# Patient Record
Sex: Female | Born: 1945 | Race: White | Hispanic: No | Marital: Married | Smoking: Former smoker
Health system: Southern US, Community
[De-identification: ages and names within clinical notes are randomized; demographics above are authoritative.]

## PROBLEM LIST (undated history)

## (undated) DIAGNOSIS — I5032 Chronic diastolic (congestive) heart failure: Secondary | ICD-10-CM

## (undated) DIAGNOSIS — F32A Depression, unspecified: Secondary | ICD-10-CM

## (undated) DIAGNOSIS — N189 Chronic kidney disease, unspecified: Secondary | ICD-10-CM

## (undated) DIAGNOSIS — I2510 Atherosclerotic heart disease of native coronary artery without angina pectoris: Secondary | ICD-10-CM

## (undated) DIAGNOSIS — E785 Hyperlipidemia, unspecified: Secondary | ICD-10-CM

## (undated) DIAGNOSIS — G629 Polyneuropathy, unspecified: Secondary | ICD-10-CM

## (undated) DIAGNOSIS — E0590 Thyrotoxicosis, unspecified without thyrotoxic crisis or storm: Secondary | ICD-10-CM

## (undated) DIAGNOSIS — F329 Major depressive disorder, single episode, unspecified: Secondary | ICD-10-CM

## (undated) DIAGNOSIS — M797 Fibromyalgia: Secondary | ICD-10-CM

## (undated) DIAGNOSIS — E119 Type 2 diabetes mellitus without complications: Secondary | ICD-10-CM

## (undated) DIAGNOSIS — G709 Myoneural disorder, unspecified: Secondary | ICD-10-CM

## (undated) HISTORY — DX: Depression, unspecified: F32.A

## (undated) HISTORY — PX: CORONARY ARTERY BYPASS GRAFT: SHX141

## (undated) HISTORY — DX: Thyrotoxicosis, unspecified without thyrotoxic crisis or storm: E05.90

## (undated) HISTORY — DX: Atherosclerotic heart disease of native coronary artery without angina pectoris: I25.10

## (undated) HISTORY — DX: Hyperlipidemia, unspecified: E78.5

## (undated) HISTORY — DX: Fibromyalgia: M79.7

## (undated) HISTORY — DX: Major depressive disorder, single episode, unspecified: F32.9

## (undated) HISTORY — PX: KNEE SURGERY: SHX244

## (undated) HISTORY — DX: Myoneural disorder, unspecified: G70.9

## (undated) HISTORY — PX: CARDIAC VALVE REPLACEMENT: SHX585

## (undated) HISTORY — DX: Chronic kidney disease, unspecified: N18.9

---

## 1998-07-11 ENCOUNTER — Encounter: Payer: Self-pay | Admitting: Emergency Medicine

## 1998-07-12 ENCOUNTER — Inpatient Hospital Stay (HOSPITAL_COMMUNITY): Admission: EM | Admit: 1998-07-12 | Discharge: 1998-07-14 | Payer: Self-pay | Admitting: Emergency Medicine

## 1998-07-15 ENCOUNTER — Encounter: Admission: RE | Admit: 1998-07-15 | Discharge: 1998-07-15 | Payer: Self-pay | Admitting: Family Medicine

## 2000-03-01 ENCOUNTER — Emergency Department (HOSPITAL_COMMUNITY): Admission: EM | Admit: 2000-03-01 | Discharge: 2000-03-01 | Payer: Self-pay | Admitting: Emergency Medicine

## 2000-03-02 ENCOUNTER — Encounter: Payer: Self-pay | Admitting: Emergency Medicine

## 2003-02-07 ENCOUNTER — Encounter: Payer: Self-pay | Admitting: Otolaryngology

## 2003-02-07 ENCOUNTER — Encounter: Admission: RE | Admit: 2003-02-07 | Discharge: 2003-02-07 | Payer: Self-pay | Admitting: Otolaryngology

## 2004-07-10 ENCOUNTER — Emergency Department (HOSPITAL_COMMUNITY): Admission: EM | Admit: 2004-07-10 | Discharge: 2004-07-11 | Payer: Self-pay | Admitting: Emergency Medicine

## 2006-03-15 ENCOUNTER — Encounter: Payer: Self-pay | Admitting: Family Medicine

## 2011-10-10 ENCOUNTER — Ambulatory Visit (INDEPENDENT_AMBULATORY_CARE_PROVIDER_SITE_OTHER): Payer: Medicare Other | Admitting: Ophthalmology

## 2011-10-10 DIAGNOSIS — H35039 Hypertensive retinopathy, unspecified eye: Secondary | ICD-10-CM

## 2011-10-10 DIAGNOSIS — E11359 Type 2 diabetes mellitus with proliferative diabetic retinopathy without macular edema: Secondary | ICD-10-CM

## 2011-10-10 DIAGNOSIS — I10 Essential (primary) hypertension: Secondary | ICD-10-CM

## 2011-10-10 DIAGNOSIS — E1139 Type 2 diabetes mellitus with other diabetic ophthalmic complication: Secondary | ICD-10-CM

## 2011-10-13 ENCOUNTER — Other Ambulatory Visit (INDEPENDENT_AMBULATORY_CARE_PROVIDER_SITE_OTHER): Payer: Medicare Other | Admitting: Ophthalmology

## 2011-10-13 DIAGNOSIS — H3581 Retinal edema: Secondary | ICD-10-CM

## 2012-02-10 ENCOUNTER — Ambulatory Visit (INDEPENDENT_AMBULATORY_CARE_PROVIDER_SITE_OTHER): Payer: Medicare Other | Admitting: Ophthalmology

## 2012-02-10 DIAGNOSIS — H2000 Unspecified acute and subacute iridocyclitis: Secondary | ICD-10-CM

## 2012-02-10 DIAGNOSIS — E1139 Type 2 diabetes mellitus with other diabetic ophthalmic complication: Secondary | ICD-10-CM

## 2012-02-10 DIAGNOSIS — I10 Essential (primary) hypertension: Secondary | ICD-10-CM

## 2012-02-10 DIAGNOSIS — H35039 Hypertensive retinopathy, unspecified eye: Secondary | ICD-10-CM

## 2012-02-10 DIAGNOSIS — H43819 Vitreous degeneration, unspecified eye: Secondary | ICD-10-CM

## 2012-02-10 DIAGNOSIS — E11359 Type 2 diabetes mellitus with proliferative diabetic retinopathy without macular edema: Secondary | ICD-10-CM

## 2012-03-02 ENCOUNTER — Encounter (INDEPENDENT_AMBULATORY_CARE_PROVIDER_SITE_OTHER): Payer: Medicare Other | Admitting: Ophthalmology

## 2012-03-02 DIAGNOSIS — I10 Essential (primary) hypertension: Secondary | ICD-10-CM

## 2012-03-02 DIAGNOSIS — E11359 Type 2 diabetes mellitus with proliferative diabetic retinopathy without macular edema: Secondary | ICD-10-CM

## 2012-03-02 DIAGNOSIS — E1139 Type 2 diabetes mellitus with other diabetic ophthalmic complication: Secondary | ICD-10-CM

## 2012-03-02 DIAGNOSIS — H43819 Vitreous degeneration, unspecified eye: Secondary | ICD-10-CM

## 2012-03-02 DIAGNOSIS — H2000 Unspecified acute and subacute iridocyclitis: Secondary | ICD-10-CM

## 2012-05-02 ENCOUNTER — Encounter (INDEPENDENT_AMBULATORY_CARE_PROVIDER_SITE_OTHER): Payer: Medicare Other | Admitting: Ophthalmology

## 2012-05-02 DIAGNOSIS — H20029 Recurrent acute iridocyclitis, unspecified eye: Secondary | ICD-10-CM

## 2012-05-02 DIAGNOSIS — E1139 Type 2 diabetes mellitus with other diabetic ophthalmic complication: Secondary | ICD-10-CM

## 2012-05-02 DIAGNOSIS — E11359 Type 2 diabetes mellitus with proliferative diabetic retinopathy without macular edema: Secondary | ICD-10-CM

## 2012-05-23 ENCOUNTER — Encounter (INDEPENDENT_AMBULATORY_CARE_PROVIDER_SITE_OTHER): Payer: Medicare Other | Admitting: Ophthalmology

## 2012-05-23 DIAGNOSIS — H35039 Hypertensive retinopathy, unspecified eye: Secondary | ICD-10-CM

## 2012-05-23 DIAGNOSIS — E1165 Type 2 diabetes mellitus with hyperglycemia: Secondary | ICD-10-CM

## 2012-05-23 DIAGNOSIS — H3581 Retinal edema: Secondary | ICD-10-CM

## 2012-05-23 DIAGNOSIS — H43819 Vitreous degeneration, unspecified eye: Secondary | ICD-10-CM

## 2012-05-23 DIAGNOSIS — H209 Unspecified iridocyclitis: Secondary | ICD-10-CM

## 2012-05-23 DIAGNOSIS — E11359 Type 2 diabetes mellitus with proliferative diabetic retinopathy without macular edema: Secondary | ICD-10-CM

## 2012-05-23 DIAGNOSIS — E1139 Type 2 diabetes mellitus with other diabetic ophthalmic complication: Secondary | ICD-10-CM

## 2012-05-23 DIAGNOSIS — I10 Essential (primary) hypertension: Secondary | ICD-10-CM

## 2012-06-27 ENCOUNTER — Encounter (INDEPENDENT_AMBULATORY_CARE_PROVIDER_SITE_OTHER): Payer: Medicare Other | Admitting: Ophthalmology

## 2012-06-27 DIAGNOSIS — H35039 Hypertensive retinopathy, unspecified eye: Secondary | ICD-10-CM

## 2012-06-27 DIAGNOSIS — I10 Essential (primary) hypertension: Secondary | ICD-10-CM

## 2012-06-27 DIAGNOSIS — E1139 Type 2 diabetes mellitus with other diabetic ophthalmic complication: Secondary | ICD-10-CM

## 2012-06-27 DIAGNOSIS — E1165 Type 2 diabetes mellitus with hyperglycemia: Secondary | ICD-10-CM

## 2012-06-27 DIAGNOSIS — H43819 Vitreous degeneration, unspecified eye: Secondary | ICD-10-CM

## 2012-06-27 DIAGNOSIS — E11359 Type 2 diabetes mellitus with proliferative diabetic retinopathy without macular edema: Secondary | ICD-10-CM

## 2012-06-27 DIAGNOSIS — H1820 Unspecified corneal edema: Secondary | ICD-10-CM

## 2013-01-01 ENCOUNTER — Ambulatory Visit (INDEPENDENT_AMBULATORY_CARE_PROVIDER_SITE_OTHER): Payer: Medicare Other | Admitting: Ophthalmology

## 2013-01-01 DIAGNOSIS — I10 Essential (primary) hypertension: Secondary | ICD-10-CM

## 2013-01-01 DIAGNOSIS — E1165 Type 2 diabetes mellitus with hyperglycemia: Secondary | ICD-10-CM

## 2013-01-01 DIAGNOSIS — H43819 Vitreous degeneration, unspecified eye: Secondary | ICD-10-CM

## 2013-01-01 DIAGNOSIS — H35039 Hypertensive retinopathy, unspecified eye: Secondary | ICD-10-CM

## 2013-01-01 DIAGNOSIS — E1139 Type 2 diabetes mellitus with other diabetic ophthalmic complication: Secondary | ICD-10-CM

## 2013-01-01 DIAGNOSIS — E11359 Type 2 diabetes mellitus with proliferative diabetic retinopathy without macular edema: Secondary | ICD-10-CM

## 2013-02-21 ENCOUNTER — Encounter (INDEPENDENT_AMBULATORY_CARE_PROVIDER_SITE_OTHER): Payer: Medicare Other | Admitting: Ophthalmology

## 2013-02-21 DIAGNOSIS — E11359 Type 2 diabetes mellitus with proliferative diabetic retinopathy without macular edema: Secondary | ICD-10-CM

## 2013-02-21 DIAGNOSIS — H35039 Hypertensive retinopathy, unspecified eye: Secondary | ICD-10-CM

## 2013-02-21 DIAGNOSIS — E1139 Type 2 diabetes mellitus with other diabetic ophthalmic complication: Secondary | ICD-10-CM

## 2013-02-21 DIAGNOSIS — I10 Essential (primary) hypertension: Secondary | ICD-10-CM

## 2013-02-21 DIAGNOSIS — H43819 Vitreous degeneration, unspecified eye: Secondary | ICD-10-CM

## 2013-02-21 DIAGNOSIS — E1165 Type 2 diabetes mellitus with hyperglycemia: Secondary | ICD-10-CM

## 2013-04-11 ENCOUNTER — Emergency Department (HOSPITAL_COMMUNITY)
Admission: EM | Admit: 2013-04-11 | Discharge: 2013-04-11 | Disposition: A | Discharge status: Home / Self Care | Payer: PRIVATE HEALTH INSURANCE | Attending: Emergency Medicine | Admitting: Emergency Medicine

## 2013-04-11 ENCOUNTER — Emergency Department (HOSPITAL_COMMUNITY): Payer: PRIVATE HEALTH INSURANCE

## 2013-04-11 ENCOUNTER — Encounter (HOSPITAL_COMMUNITY): Payer: Self-pay | Admitting: Emergency Medicine

## 2013-04-11 DIAGNOSIS — G8929 Other chronic pain: Secondary | ICD-10-CM | POA: Insufficient documentation

## 2013-04-11 DIAGNOSIS — Y9389 Activity, other specified: Secondary | ICD-10-CM | POA: Insufficient documentation

## 2013-04-11 DIAGNOSIS — M542 Cervicalgia: Secondary | ICD-10-CM | POA: Insufficient documentation

## 2013-04-11 DIAGNOSIS — Z794 Long term (current) use of insulin: Secondary | ICD-10-CM | POA: Insufficient documentation

## 2013-04-11 DIAGNOSIS — R6889 Other general symptoms and signs: Secondary | ICD-10-CM | POA: Insufficient documentation

## 2013-04-11 DIAGNOSIS — Z79899 Other long term (current) drug therapy: Secondary | ICD-10-CM | POA: Insufficient documentation

## 2013-04-11 DIAGNOSIS — Y92410 Unspecified street and highway as the place of occurrence of the external cause: Secondary | ICD-10-CM | POA: Insufficient documentation

## 2013-04-11 DIAGNOSIS — S0993XA Unspecified injury of face, initial encounter: Secondary | ICD-10-CM | POA: Insufficient documentation

## 2013-04-11 DIAGNOSIS — E1149 Type 2 diabetes mellitus with other diabetic neurological complication: Secondary | ICD-10-CM | POA: Insufficient documentation

## 2013-04-11 DIAGNOSIS — E1142 Type 2 diabetes mellitus with diabetic polyneuropathy: Secondary | ICD-10-CM | POA: Insufficient documentation

## 2013-04-11 DIAGNOSIS — IMO0001 Reserved for inherently not codable concepts without codable children: Secondary | ICD-10-CM | POA: Insufficient documentation

## 2013-04-11 DIAGNOSIS — E785 Hyperlipidemia, unspecified: Secondary | ICD-10-CM | POA: Insufficient documentation

## 2013-04-11 HISTORY — DX: Type 2 diabetes mellitus without complications: E11.9

## 2013-04-11 MED ORDER — OXYCODONE-ACETAMINOPHEN 5-325 MG PO TABS: 1.0000 | ORAL_TABLET | ORAL | Status: DC | PRN

## 2013-04-11 MED ORDER — FLUORESCEIN SODIUM 1 MG OP STRP
1.0000 | ORAL_STRIP | Freq: Once | OPHTHALMIC | Status: AC
  Administered 2013-04-11: 1 via OPHTHALMIC
  Filled 2013-04-11: qty 1

## 2013-04-11 MED ORDER — TETRACAINE HCL 0.5 % OP SOLN
1.0000 [drp] | Freq: Once | OPHTHALMIC | Status: AC
  Administered 2013-04-11: 1 [drp] via OPHTHALMIC
  Filled 2013-04-11: qty 2

## 2013-04-11 MED ORDER — FLUORESCEIN SODIUM 1 MG OP STRP
1.0000 | ORAL_STRIP | Freq: Once | OPHTHALMIC | Status: AC
  Administered 2013-04-11: 1 via OPHTHALMIC

## 2013-04-11 MED ORDER — FLUORESCEIN SODIUM 1 MG OP STRP
ORAL_STRIP | OPHTHALMIC | Status: AC
  Filled 2013-04-11: qty 1

## 2013-04-11 MED ORDER — OXYCODONE-ACETAMINOPHEN 5-325 MG PO TABS
1.0000 | ORAL_TABLET | Freq: Once | ORAL | Status: AC
  Administered 2013-04-11: 1 via ORAL
  Filled 2013-04-11: qty 1

## 2013-04-11 NOTE — ED Notes (Signed)
Jennifer PA at bedside 

## 2013-04-11 NOTE — ED Notes (Signed)
Jen P, PA at bedside  
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
## 2013-04-13 NOTE — ED Provider Notes (Deleted)
Medical screening examination/treatment/procedure(s) were performed by non-physician practitioner and as supervising physician I was immediately available for consultation/collaboration.  Dione Booze, MD 04/13/13 [phone]

## 2013-07-01 ENCOUNTER — Ambulatory Visit (INDEPENDENT_AMBULATORY_CARE_PROVIDER_SITE_OTHER): Payer: Medicare Other | Admitting: Ophthalmology

## 2013-07-12 ENCOUNTER — Ambulatory Visit (INDEPENDENT_AMBULATORY_CARE_PROVIDER_SITE_OTHER): Payer: Medicare Other | Admitting: Ophthalmology

## 2013-07-12 DIAGNOSIS — E11359 Type 2 diabetes mellitus with proliferative diabetic retinopathy without macular edema: Secondary | ICD-10-CM

## 2013-07-12 DIAGNOSIS — I10 Essential (primary) hypertension: Secondary | ICD-10-CM

## 2013-07-12 DIAGNOSIS — H43819 Vitreous degeneration, unspecified eye: Secondary | ICD-10-CM

## 2013-07-12 DIAGNOSIS — E1139 Type 2 diabetes mellitus with other diabetic ophthalmic complication: Secondary | ICD-10-CM

## 2013-07-12 DIAGNOSIS — H35039 Hypertensive retinopathy, unspecified eye: Secondary | ICD-10-CM

## 2013-08-23 ENCOUNTER — Encounter (INDEPENDENT_AMBULATORY_CARE_PROVIDER_SITE_OTHER): Payer: Medicare Other | Admitting: Ophthalmology

## 2013-08-23 DIAGNOSIS — E1139 Type 2 diabetes mellitus with other diabetic ophthalmic complication: Secondary | ICD-10-CM

## 2013-08-23 DIAGNOSIS — H43819 Vitreous degeneration, unspecified eye: Secondary | ICD-10-CM

## 2013-08-23 DIAGNOSIS — E11359 Type 2 diabetes mellitus with proliferative diabetic retinopathy without macular edema: Secondary | ICD-10-CM

## 2013-08-23 DIAGNOSIS — E11311 Type 2 diabetes mellitus with unspecified diabetic retinopathy with macular edema: Secondary | ICD-10-CM

## 2013-08-23 DIAGNOSIS — H35039 Hypertensive retinopathy, unspecified eye: Secondary | ICD-10-CM

## 2013-08-23 DIAGNOSIS — I10 Essential (primary) hypertension: Secondary | ICD-10-CM

## 2013-10-18 ENCOUNTER — Encounter (INDEPENDENT_AMBULATORY_CARE_PROVIDER_SITE_OTHER): Payer: Medicare Other | Admitting: Ophthalmology

## 2013-10-18 DIAGNOSIS — E11359 Type 2 diabetes mellitus with proliferative diabetic retinopathy without macular edema: Secondary | ICD-10-CM

## 2013-10-18 DIAGNOSIS — H3581 Retinal edema: Secondary | ICD-10-CM

## 2013-10-18 DIAGNOSIS — H35379 Puckering of macula, unspecified eye: Secondary | ICD-10-CM

## 2013-10-18 DIAGNOSIS — E1039 Type 1 diabetes mellitus with other diabetic ophthalmic complication: Secondary | ICD-10-CM

## 2013-10-18 DIAGNOSIS — H43819 Vitreous degeneration, unspecified eye: Secondary | ICD-10-CM

## 2014-01-16 ENCOUNTER — Encounter (INDEPENDENT_AMBULATORY_CARE_PROVIDER_SITE_OTHER): Payer: Medicare Other | Admitting: Ophthalmology

## 2014-01-16 DIAGNOSIS — E1139 Type 2 diabetes mellitus with other diabetic ophthalmic complication: Secondary | ICD-10-CM

## 2014-01-16 DIAGNOSIS — E1165 Type 2 diabetes mellitus with hyperglycemia: Secondary | ICD-10-CM

## 2014-01-16 DIAGNOSIS — I10 Essential (primary) hypertension: Secondary | ICD-10-CM

## 2014-01-16 DIAGNOSIS — E11311 Type 2 diabetes mellitus with unspecified diabetic retinopathy with macular edema: Secondary | ICD-10-CM

## 2014-01-16 DIAGNOSIS — H43819 Vitreous degeneration, unspecified eye: Secondary | ICD-10-CM

## 2014-01-16 DIAGNOSIS — E11359 Type 2 diabetes mellitus with proliferative diabetic retinopathy without macular edema: Secondary | ICD-10-CM

## 2014-01-16 DIAGNOSIS — H35039 Hypertensive retinopathy, unspecified eye: Secondary | ICD-10-CM

## 2014-05-19 ENCOUNTER — Ambulatory Visit (INDEPENDENT_AMBULATORY_CARE_PROVIDER_SITE_OTHER): Payer: Medicare Other | Admitting: Ophthalmology

## 2014-05-19 DIAGNOSIS — E1065 Type 1 diabetes mellitus with hyperglycemia: Secondary | ICD-10-CM

## 2014-05-19 DIAGNOSIS — H43819 Vitreous degeneration, unspecified eye: Secondary | ICD-10-CM

## 2014-05-19 DIAGNOSIS — H3581 Retinal edema: Secondary | ICD-10-CM

## 2014-05-19 DIAGNOSIS — H35039 Hypertensive retinopathy, unspecified eye: Secondary | ICD-10-CM

## 2014-05-19 DIAGNOSIS — I10 Essential (primary) hypertension: Secondary | ICD-10-CM

## 2014-05-19 DIAGNOSIS — E1039 Type 1 diabetes mellitus with other diabetic ophthalmic complication: Secondary | ICD-10-CM

## 2014-05-19 DIAGNOSIS — E11359 Type 2 diabetes mellitus with proliferative diabetic retinopathy without macular edema: Secondary | ICD-10-CM

## 2014-09-19 ENCOUNTER — Ambulatory Visit (INDEPENDENT_AMBULATORY_CARE_PROVIDER_SITE_OTHER): Payer: Medicare Other | Admitting: Ophthalmology

## 2014-10-01 ENCOUNTER — Ambulatory Visit (INDEPENDENT_AMBULATORY_CARE_PROVIDER_SITE_OTHER): Payer: Medicare Other | Admitting: Ophthalmology

## 2014-10-01 DIAGNOSIS — H35371 Puckering of macula, right eye: Secondary | ICD-10-CM

## 2014-10-01 DIAGNOSIS — H35033 Hypertensive retinopathy, bilateral: Secondary | ICD-10-CM

## 2014-10-01 DIAGNOSIS — E11351 Type 2 diabetes mellitus with proliferative diabetic retinopathy with macular edema: Secondary | ICD-10-CM

## 2014-10-01 DIAGNOSIS — H43813 Vitreous degeneration, bilateral: Secondary | ICD-10-CM

## 2014-10-01 DIAGNOSIS — E11311 Type 2 diabetes mellitus with unspecified diabetic retinopathy with macular edema: Secondary | ICD-10-CM

## 2014-10-01 DIAGNOSIS — I10 Essential (primary) hypertension: Secondary | ICD-10-CM

## 2014-12-26 ENCOUNTER — Encounter (INDEPENDENT_AMBULATORY_CARE_PROVIDER_SITE_OTHER): Payer: Medicare Other | Admitting: Ophthalmology

## 2014-12-26 DIAGNOSIS — E11359 Type 2 diabetes mellitus with proliferative diabetic retinopathy without macular edema: Secondary | ICD-10-CM

## 2014-12-26 DIAGNOSIS — E11311 Type 2 diabetes mellitus with unspecified diabetic retinopathy with macular edema: Secondary | ICD-10-CM | POA: Diagnosis not present

## 2014-12-26 DIAGNOSIS — I10 Essential (primary) hypertension: Secondary | ICD-10-CM | POA: Diagnosis not present

## 2014-12-26 DIAGNOSIS — H35371 Puckering of macula, right eye: Secondary | ICD-10-CM

## 2014-12-26 DIAGNOSIS — H35033 Hypertensive retinopathy, bilateral: Secondary | ICD-10-CM | POA: Diagnosis not present

## 2014-12-26 DIAGNOSIS — H43813 Vitreous degeneration, bilateral: Secondary | ICD-10-CM

## 2014-12-26 DIAGNOSIS — E11351 Type 2 diabetes mellitus with proliferative diabetic retinopathy with macular edema: Secondary | ICD-10-CM

## 2015-04-01 ENCOUNTER — Ambulatory Visit (INDEPENDENT_AMBULATORY_CARE_PROVIDER_SITE_OTHER): Payer: Medicare Other | Admitting: Ophthalmology

## 2015-06-29 ENCOUNTER — Ambulatory Visit (INDEPENDENT_AMBULATORY_CARE_PROVIDER_SITE_OTHER): Payer: Medicare Other | Admitting: Ophthalmology

## 2015-08-05 ENCOUNTER — Ambulatory Visit (INDEPENDENT_AMBULATORY_CARE_PROVIDER_SITE_OTHER): Payer: Medicare Other | Admitting: Ophthalmology

## 2015-08-05 DIAGNOSIS — H4313 Vitreous hemorrhage, bilateral: Secondary | ICD-10-CM | POA: Diagnosis not present

## 2015-08-05 DIAGNOSIS — E11359 Type 2 diabetes mellitus with proliferative diabetic retinopathy without macular edema: Secondary | ICD-10-CM | POA: Diagnosis not present

## 2015-08-05 DIAGNOSIS — H35371 Puckering of macula, right eye: Secondary | ICD-10-CM | POA: Diagnosis not present

## 2015-08-05 DIAGNOSIS — E11311 Type 2 diabetes mellitus with unspecified diabetic retinopathy with macular edema: Secondary | ICD-10-CM

## 2015-08-05 DIAGNOSIS — E11351 Type 2 diabetes mellitus with proliferative diabetic retinopathy with macular edema: Secondary | ICD-10-CM

## 2015-08-05 DIAGNOSIS — I10 Essential (primary) hypertension: Secondary | ICD-10-CM

## 2015-08-05 DIAGNOSIS — H35033 Hypertensive retinopathy, bilateral: Secondary | ICD-10-CM

## 2016-05-04 ENCOUNTER — Ambulatory Visit (INDEPENDENT_AMBULATORY_CARE_PROVIDER_SITE_OTHER): Payer: Medicare Other | Admitting: Ophthalmology

## 2016-05-04 DIAGNOSIS — I10 Essential (primary) hypertension: Secondary | ICD-10-CM

## 2016-05-04 DIAGNOSIS — E113513 Type 2 diabetes mellitus with proliferative diabetic retinopathy with macular edema, bilateral: Secondary | ICD-10-CM

## 2016-05-04 DIAGNOSIS — E11319 Type 2 diabetes mellitus with unspecified diabetic retinopathy without macular edema: Secondary | ICD-10-CM

## 2016-05-04 DIAGNOSIS — H35033 Hypertensive retinopathy, bilateral: Secondary | ICD-10-CM | POA: Diagnosis not present

## 2016-05-04 DIAGNOSIS — H43813 Vitreous degeneration, bilateral: Secondary | ICD-10-CM | POA: Diagnosis not present

## 2016-05-04 DIAGNOSIS — H35371 Puckering of macula, right eye: Secondary | ICD-10-CM | POA: Diagnosis not present

## 2017-02-08 ENCOUNTER — Ambulatory Visit (INDEPENDENT_AMBULATORY_CARE_PROVIDER_SITE_OTHER): Payer: Medicare Other | Admitting: Ophthalmology

## 2017-02-08 DIAGNOSIS — I10 Essential (primary) hypertension: Secondary | ICD-10-CM | POA: Diagnosis not present

## 2017-02-08 DIAGNOSIS — H35033 Hypertensive retinopathy, bilateral: Secondary | ICD-10-CM

## 2017-02-08 DIAGNOSIS — E11311 Type 2 diabetes mellitus with unspecified diabetic retinopathy with macular edema: Secondary | ICD-10-CM

## 2017-02-08 DIAGNOSIS — E113592 Type 2 diabetes mellitus with proliferative diabetic retinopathy without macular edema, left eye: Secondary | ICD-10-CM

## 2017-02-08 DIAGNOSIS — E113511 Type 2 diabetes mellitus with proliferative diabetic retinopathy with macular edema, right eye: Secondary | ICD-10-CM | POA: Diagnosis not present

## 2017-02-08 DIAGNOSIS — H43813 Vitreous degeneration, bilateral: Secondary | ICD-10-CM

## 2017-04-13 ENCOUNTER — Ambulatory Visit (INDEPENDENT_AMBULATORY_CARE_PROVIDER_SITE_OTHER): Payer: Medicare Other | Admitting: Family Medicine

## 2017-04-13 ENCOUNTER — Encounter: Payer: Self-pay | Admitting: Family Medicine

## 2017-04-13 VITALS — BP 130/64 | HR 60 | Temp 98.2°F | Resp 18 | Ht 62.0 in | Wt 176.0 lb

## 2017-04-13 DIAGNOSIS — Z952 Presence of prosthetic heart valve: Secondary | ICD-10-CM

## 2017-04-13 LAB — COMPLETE METABOLIC PANEL WITH GFR
ALT: 18 U/L (ref 6–29)
AST: 18 U/L (ref 10–35)
Albumin: 3.5 g/dL — ABNORMAL LOW (ref 3.6–5.1)
Alkaline Phosphatase: 114 U/L (ref 33–130)
BILIRUBIN TOTAL: 0.3 mg/dL (ref 0.2–1.2)
BUN: 40 mg/dL — ABNORMAL HIGH (ref 7–25)
CHLORIDE: 102 mmol/L (ref 98–110)
CO2: 27 mmol/L (ref 20–31)
CREATININE: 1.76 mg/dL — AB (ref 0.60–0.93)
Calcium: 9.5 mg/dL (ref 8.6–10.4)
GFR, Est African American: 33 mL/min — ABNORMAL LOW (ref >=60)
GFR, Est Non African American: 28 mL/min — ABNORMAL LOW (ref >=60)
GLUCOSE: 216 mg/dL — AB (ref 70–99)
Potassium: 5.3 mmol/L (ref 3.5–5.3)
SODIUM: 139 mmol/L (ref 135–146)
TOTAL PROTEIN: 6.1 g/dL (ref 6.1–8.1)

## 2017-04-13 LAB — CBC WITH DIFFERENTIAL/PLATELET
BASOS PCT: 0 %
Basophils Absolute: 0 cells/uL (ref 0–200)
EOS ABS: 416 {cells}/uL (ref 15–500)
EOS PCT: 4 %
HCT: 34.9 % — ABNORMAL LOW (ref 35.0–45.0)
Hemoglobin: 11.5 g/dL — ABNORMAL LOW (ref 12.0–15.0)
Lymphocytes Relative: 28 %
Lymphs Abs: 2912 cells/uL (ref 850–3900)
MCH: 32 pg (ref 27.0–33.0)
MCHC: 33 g/dL (ref 32.0–36.0)
MCV: 97.2 fL (ref 80.0–100.0)
MPV: 10 fL (ref 7.5–12.5)
Monocytes Absolute: 624 cells/uL (ref 200–950)
Monocytes Relative: 6 %
NEUTROS ABS: 6448 {cells}/uL (ref 1500–7800)
Neutrophils Relative %: 62 %
Platelets: 314 10*3/uL (ref 140–400)
RBC: 3.59 MIL/uL — AB (ref 3.80–5.10)
RDW: 13.8 % (ref 11.0–15.0)
WBC: 10.4 10*3/uL (ref 3.8–10.8)

## 2017-04-13 LAB — PT WITH INR/FINGERSTICK
INR FINGERSTICK: 0.9 (ref 0.9–1.1)
PT FINGERSTICK: 11.3 s (ref 10.5–13.1)

## 2017-04-13 NOTE — Progress Notes (Signed)
Subjective:    Patient ID: Sonya Campbell, female    DOB: 05-07-1946, 71 y.o.   MRN: [id number]  HPI Patient is a very pleasant 71 year old white female with a very complicated past medical history. Her primary care physician is Dr. Rejeana Brock in [facility].  He manages her insulin-dependent diabetes mellitus, her hyperthyroidism, her depression, fibromyalgia, her peripheral neuropathy, and her hyperlipidemia area and she also has a history of coronary artery disease status post CABG that was performed 5 years ago. She sees a cardiologist in [place] as well. She has a history of an aortic valve replacement with bovine valve. Recently on an echocardiogram, the cardiologist came concerned about a possible vegetation on the valve. He wanted to try the patient on Coumadin for 6-8 months to see if this would regress spontaneously versus requiring surgery. Patient lives close to the office and wanted to have a doctor) to check her Coumadin rather than have to drive to [place]. She was still prefer to see her regular doctor since she has an established relationship with for her chronic medical conditions. She would like simply to check her Coumadin for the next 6-8 weeks to avoid the long commute. The remainder of her preventative care including her immunizations, cancer screening are performed at her primary care physician in [place].  She has not yet started Coumadin. Past Medical History:  Diagnosis Date  . CAD (coronary artery disease)   . Chronic kidney disease    stage 3  . Depression   . Diabetes mellitus without complication (HCC)   . Fibromyalgia   . Hyperlipidemia   . Hyperthyroidism   . 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 CORONARY ARTERY BYPASS GRAFT     Current Outpatient  Prescriptions on File Prior to Visit  Medication Sig Dispense Refill  . FLUoxetine (PROZAC) 20 MG capsule Take 60 mg by mouth daily. Takes 40mg  in morning and 20mg  in evening.    . furosemide (LASIX) 20 MG tablet Take 60 mg by mouth daily.    . insulin glargine (LANTUS) 100 UNIT/ML injection Inject 15 Units into the skin 2 (two) times daily.     . insulin regular (HUMULIN R) 100 units/mL injection Inject 2-6 Units into the skin 3 (three) times daily before meals. Sliding scale    . methimazole (TAPAZOLE) 5 MG tablet Take 5 mg by mouth every morning.     . propranolol (INDERAL) 40 MG tablet Take 40 mg by mouth 2 (two) times daily.     No current facility-administered medications on file prior to visit.    Allergies  Allergen Reactions  . Shrimp [Shellfish Allergy] Shortness Of Breath and Swelling    stated to her by allergist in past.   . Beef-Derived Products Other (See Comments)    Unknown, maybe swelling-patient cant recall at this time, stated to her by allergist in past.  . Benadryl [Diphenhydramine] Other (See Comments)    Causes her to fall asleep, also not able to wake her.    Corky Sox [Cefaclor]     Unknown   . Metformin And Related Nausea And Vomiting  . Other Itching    Unknown med at this time.   . Pork-Derived Products Other (See Comments)    Unknown, stated to her by allergist from past.  Social History   Social History  . Marital status: Married    Spouse name: N/A  . Number of children: N/A  . Years of education: N/A   Occupational History  . Not on file.   Social History Main Topics  . Smoking status: Former Games developermoker  . Smokeless tobacco: Former NeurosurgeonUser  . Alcohol use No  . Drug use: No  . Sexual activity: Not on file   Other Topics Concern  . Not on file   Social History Narrative  . No narrative on file      Review of Systems  All other systems reviewed and are negative.      Objective:   Physical Exam  Constitutional: She appears  well-developed and well-nourished.  Cardiovascular: Normal rate and regular rhythm.   Murmur heard. Pulmonary/Chest: Effort normal and breath sounds normal. No respiratory distress. She has no wheezes. She has no rales.  Abdominal: Soft. Bowel sounds are normal. She exhibits no distension. There is no tenderness. There is no rebound and no guarding.  Neurological: Coordination normal.  Vitals reviewed.         Assessment & Plan:  H/O aortic valve replacement - Plan: PT with INR/Fingerstick, PT with INR/Fingerstick, CBC with Differential/Platelet, COMPLETE METABOLIC PANEL WITH GFR  Patient wants to maintain her current relationship with her primary care physician and her cardiologist which I certainly support. Therefore we will serve only to monitor her Coumadin to avoid polypharmacy and making any medication changes that [place] negatively impact her care. I will check an INR today. I would like to get a baseline CBC as well as a CMP to evaluate for any anemia or liver dysfunction. Start Coumadin 5 mg a day and recheck INR next week. Titrate Coumadin to achieve an INR between 2.5 and 3.5. We discussed dietary restrictions on Coumadin. We discussed warning signs of a GI bleed. Recheck the patient next week

## 2017-04-20 ENCOUNTER — Ambulatory Visit (INDEPENDENT_AMBULATORY_CARE_PROVIDER_SITE_OTHER): Payer: Medicare Other | Admitting: Family Medicine

## 2017-04-20 ENCOUNTER — Encounter: Payer: Self-pay | Admitting: Family Medicine

## 2017-04-20 VITALS — BP 146/64 | HR 80 | Temp 97.9°F | Resp 18 | Ht 62.0 in | Wt 176.0 lb

## 2017-04-20 DIAGNOSIS — Z952 Presence of prosthetic heart valve: Secondary | ICD-10-CM

## 2017-04-20 LAB — PT WITH INR/FINGERSTICK
INR, fingerstick: 1.5 — ABNORMAL HIGH (ref 0.9–1.1)
PT FINGERSTICK: 18.4 s — AB (ref 10.5–13.1)

## 2017-04-20 NOTE — Progress Notes (Signed)
Subjective:    Patient ID: Sonya Campbell, female    DOB: 11-23-1945, 71 y.o.   MRN: [id number]  HPI  04/13/17 Patient is a very pleasant 71 year old white female with a very complicated past medical history. Her primary care physician is Dr. Rejeana BrockPetit [facility].  He manages her insulin-dependent diabetes mellitus, her hyperthyroidism, her depression, fibromyalgia, her peripheral neuropathy, and her hyperlipidemia area and she also has a history of coronary artery disease status post CABG that was performed 5 years ago. [place] in [place]. She has a history of an aortic valve replacement with bovine valve. Recently on an echocardiogram, the cardiologist came concerned about a possible vegetation on the valve. He wanted to try the patient on Coumadin for 6-8 months to see if this would regress spontaneously versus requiring surgery. Patient lives close to the office and wanted to have a doctor) to check her Coumadin rather than have to drive to [place]. She was still prefer to see her regular doctor since she has an established relationship with for her chronic medical conditions. She would like simply to check her Coumadin for the next 6-8 weeks to avoid the long commute. The remainder of her preventative care including her immunizations, cancer screening are performed at [place] in [place].  She has not yet started Coumadin.  At that time, my plan was: Patient wants to maintain her current relationship with her primary care physician and her cardiologist which I certainly support. Therefore we will serve only to monitor her Coumadin to avoid polypharmacy and making any medication changes that [place] negatively impact her care. I will check an INR today. I would like to get a baseline CBC as well as a CMP to evaluate for any anemia or liver dysfunction. Start Coumadin 5 mg a day and recheck INR next week. Titrate Coumadin to achieve an INR between 2.5  and 3.5. We discussed dietary restrictions on Coumadin. We discussed warning signs of a GI bleed. Recheck the patient next week  04/20/17 INR is subtherapeutic at 1.5. Currently on 5 mg a day of Coumadin. Denies any side effects. Past Medical History:  Diagnosis Date  . CAD (coronary artery disease)   . Chronic kidney disease    stage 3  . Depression   . Diabetes mellitus without complication (HCC)   . Fibromyalgia   . Hyperlipidemia   . Hyperthyroidism   . 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 CORONARY ARTERY BYPASS GRAFT     Current Outpatient Prescriptions on File Prior to Visit  Medication Sig Dispense Refill  . acetaminophen (TYLENOL) 500 MG tablet Take 500 mg by mouth every 6 (six) hours as needed.    Marland Kitchen. aspirin 325 MG tablet Take 325 mg by mouth daily.    . brimonidine (ALPHAGAN) 0.15 % ophthalmic solution Place 1 drop into both eyes 3 (three) times a day.     . Cyanocobalamin (VITAMIN B 12 PO) Take 1,000 mcg by mouth daily.    Marland Kitchen. dicyclomine (BENTYL) 20 MG tablet Take 20 mg by mouth 2 (two) times daily.  5  . doxepin (SINEQUAN) 10 MG capsule Take 10 mg by mouth at bedtime.    . fluorometholone (FML) 0.1 % ophthalmic suspension 1 drop in left eye  three times a day  1  . FLUoxetine (PROZAC) 20 MG capsule Take 60 mg by mouth daily. Takes 40mg  in morning and 20mg  in evening.    . furosemide (LASIX) 20 MG tablet Take 60 mg by mouth daily.    Marland Kitchen gabapentin (NEURONTIN) 300 MG capsule Take 600 mg by mouth 3 (three) times daily.    . insulin glargine (LANTUS) 100 UNIT/ML injection Inject 15 Units into the skin 2 (two) times daily.     . insulin regular (HUMULIN R) 100 units/mL injection Inject 2-6 Units into the skin 3 (three) times daily before meals. Sliding scale    . isosorbide mononitrate  (IMDUR) 60 MG 24 hr tablet TAKE 1 TABLET (60 MG TOTAL) BY MOUTH EVERY MORNING.  11  . Lifitegrast (XIIDRA) 5 % SOLN Apply 1 drop to eye 2 (two) times daily.    Marland Kitchen loteprednol (LOTEMAX) 0.5 % ophthalmic suspension     . Loteprednol Etabonate (LOTEMAX) 0.5 % GEL Place 1 drop into both eyes daily.     . methimazole (TAPAZOLE) 5 MG tablet Take 5 mg by mouth every morning.     . metoprolol tartrate (LOPRESSOR) 50 MG tablet Take 50 mg by mouth 2 (two) times daily.    . mupirocin ointment (BACTROBAN) 2 % Place 1 application into the nose 2 (two) times daily.    . nitroGLYCERIN (NITROSTAT) 0.4 MG SL tablet Place 0.4 mg under the tongue every 5 (five) minutes as needed for chest pain.    . potassium chloride (K-DUR) 10 MEQ tablet Take by mouth.    . pravastatin (PRAVACHOL) 80 MG tablet Take one tablet (80 mg total) by mouth daily.  11  . propranolol (INDERAL) 40 MG tablet Take 40 mg by mouth 2 (two) times daily.    Marland Kitchen pyridoxine (B-6) 100 MG tablet Take 100 mg by mouth daily.    . timolol (TIMOPTIC) 0.5 % ophthalmic solution Place 1 drop into both eyes 2 (two) times daily.    . traMADol (ULTRAM) 50 MG tablet 1-2 tabs every 6 hrs for pain    . triamcinolone ointment (KENALOG) 0.1 % Apply to affected area twice per week (use Secura on all other days)    . valACYclovir (VALTREX) 500 MG tablet Take by mouth.    . Vitamin D, Ergocalciferol, (DRISDOL) 50000 units CAPS capsule TAKE 1 CAPSULE (50,000 UNITS TOTAL) BY MOUTH 2 (TWO) TIMES A WEEK. [facility] AND SUNDAY)    . warfarin (COUMADIN) 5 MG tablet Take one tablet (5 mg total) by mouth daily.  6   No current facility-administered medications on file prior to visit.    Allergies  Allergen Reactions  . Shrimp [Shellfish Allergy] Shortness Of Breath and Swelling    stated to her by allergist in past.   . Beef-Derived Products Other (See Comments)    Unknown, maybe swelling-patient cant recall at this time, stated to her by allergist in past.  . Benadryl  [Diphenhydramine] Other (See Comments)    Causes her to fall asleep, also not able to wake her.    Corky Sox [Cefaclor]     Unknown   . Metformin And Related Nausea And Vomiting  . Other Itching    Unknown med at this time.   . Pork-Derived Products Other (See Comments)    Unknown, stated to her by allergist from past.    Social History   Social History  . Marital status: Married    Spouse name: N/A  . Number of children:  N/A  . Years of education: N/A   Occupational History  . Not on file.   Social History Main Topics  . Smoking status: Former Games developer  . Smokeless tobacco: Former Neurosurgeon  . Alcohol use No  . Drug use: No  . Sexual activity: Not on file   Other Topics Concern  . Not on file   Social History Narrative  . No narrative on file      Review of Systems  All other systems reviewed and are negative.      Objective:   Physical Exam  Constitutional: She appears well-developed and well-nourished.  Cardiovascular: Normal rate and regular rhythm.   Murmur heard. Pulmonary/Chest: Effort normal and breath sounds normal. No respiratory distress. She has no wheezes. She has no rales.  Abdominal: Soft. Bowel sounds are normal. She exhibits no distension. There is no tenderness. There is no rebound and no guarding.  Neurological: Coordination normal.  Vitals reviewed.         Assessment & Plan:  H/O aortic valve replacement - Plan: PT with INR/Fingerstick, PT with INR/Fingerstick  Increase dosage of Coumadin to 7.5 mg on Tuesday, Thursday, Saturday. Continue 5 mg on Monday, Wednesday, Friday, and Sunday. Recheck INR next Thursday

## 2017-04-27 ENCOUNTER — Telehealth: Payer: Self-pay | Admitting: Family Medicine

## 2017-04-27 ENCOUNTER — Ambulatory Visit
Admission: RE | Admit: 2017-04-27 | Discharge: 2017-04-27 | Disposition: A | Discharge status: Home / Self Care | Payer: Medicare Other | Source: Ambulatory Visit | Attending: Family Medicine | Admitting: Family Medicine

## 2017-04-27 ENCOUNTER — Ambulatory Visit (INDEPENDENT_AMBULATORY_CARE_PROVIDER_SITE_OTHER): Payer: Medicare Other | Admitting: Family Medicine

## 2017-04-27 ENCOUNTER — Encounter: Payer: Self-pay | Admitting: Family Medicine

## 2017-04-27 VITALS — BP 140/68 | HR 72 | Temp 98.3°F | Resp 18 | Ht 62.0 in | Wt 173.0 lb

## 2017-04-27 DIAGNOSIS — R51 Headache: Principal | ICD-10-CM

## 2017-04-27 DIAGNOSIS — Z952 Presence of prosthetic heart valve: Secondary | ICD-10-CM | POA: Diagnosis not present

## 2017-04-27 DIAGNOSIS — R519 Headache, unspecified: Secondary | ICD-10-CM

## 2017-04-27 DIAGNOSIS — W19XXXA Unspecified fall, initial encounter: Secondary | ICD-10-CM

## 2017-04-27 DIAGNOSIS — Z7901 Long term (current) use of anticoagulants: Secondary | ICD-10-CM | POA: Diagnosis not present

## 2017-04-27 LAB — PT WITH INR/FINGERSTICK
INR, fingerstick: 2.6 — ABNORMAL HIGH (ref 0.9–1.1)
PT FINGERSTICK: 31.5 s — AB (ref 10.5–13.1)

## 2017-04-27 NOTE — Progress Notes (Signed)
Subjective:    Patient ID: Sonya Campbell, female    DOB: 01-20-1946, 71 y.o.   MRN: [id number]  HPI  04/13/17 Patient is a very pleasant 71 year old white female with a very complicated past medical history. Her primary care physician is Dr. Rejeana Brock in [facility]  He manages her insulin-dependent diabetes mellitus, her hyperthyroidism, her depression, fibromyalgia, her peripheral neuropathy, and her hyperlipidemia area and she also has a history of coronary artery disease status post CABG that was performed 5 years ago. She sees a cardiologist in [place] as well. She has a history of an aortic valve replacement with bovine valve. Recently on an echocardiogram, the cardiologist came concerned about a possible vegetation on the valve. He wanted to try the patient on Coumadin for 6-8 months to see if this would regress spontaneously versus requiring surgery. Patient lives close to the office and wanted to have a doctor) to check her Coumadin rather than have to drive to [place]. She was still prefer to see her regular doctor since she has an established relationship with for her chronic medical conditions. She would like simply to check her Coumadin for the next 6-8 weeks to avoid the long commute. The remainder of her preventative care including her immunizations, cancer screening are performed at her primary care physician in [place].  She has not yet started Coumadin.  At that time, my plan was: Patient wants to maintain her current relationship with her primary care physician and her cardiologist which I certainly support. Therefore we will serve only to monitor her Coumadin to avoid polypharmacy and making any medication changes that [place] negatively impact her care. I will check an INR today. I would like to get a baseline CBC as well as a CMP to evaluate for any anemia or liver dysfunction. Start Coumadin 5 mg a day and recheck INR next week. Titrate Coumadin to achieve an INR between 2.5  and 3.5. We discussed dietary restrictions on Coumadin. We discussed warning signs of a GI bleed. Recheck the patient next week  04/20/17 INR is subtherapeutic at 1.5. Currently on 5 mg a day of Coumadin. Denies any side effects.  AT that time, my plan was:  Increase dosage of Coumadin to 7.5 mg on Tuesday, Thursday, Saturday. Continue 5 mg on Monday, Wednesday, Friday, and Sunday. Recheck INR next Thursday.  04/27/17 INR today is therapeutic at 2.6. She is currently on 5 mg every day except Tuesday Thursday and Saturday when she takes 7.5 mg. However she is reporting a severe headache in her right occiput. Headache began after she fell and hit her head on the counter in her home while pushing her wheelchair down the hall. The area where she has the headache is the exact area where she struck the counter. She denies any neurologic deficit. Pain is somewhat reproducible in her right trapezius however I am unable to reproduce the pain by palpation in the right occiput. Patient states the headache feels like it spreading and is gradually worsening. Past Medical History:  Diagnosis Date  . CAD (coronary artery disease)   . Chronic kidney disease    stage 3  . Depression   . Diabetes mellitus without complication (HCC)   . Fibromyalgia   . Hyperlipidemia   . Hyperthyroidism   . 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 CARDIAC VALVE REPLACEMENT     aortic, bovine on coumadin for  6-8 months due to vegetation  . CORONARY ARTERY BYPASS GRAFT     Current Outpatient Prescriptions on File Prior to Visit  Medication Sig Dispense Refill  . acetaminophen (TYLENOL) 500 MG tablet Take 500 mg by mouth every 6 (six) hours as needed.    Marland Kitchen aspirin 325 MG tablet Take 325 mg by mouth daily.    . brimonidine (ALPHAGAN) 0.15 % ophthalmic solution Place 1 drop into both eyes 3 (three) times a day.     .  Cyanocobalamin (VITAMIN B 12 PO) Take 1,000 mcg by mouth daily.    Marland Kitchen dicyclomine (BENTYL) 20 MG tablet Take 20 mg by mouth 2 (two) times daily.  5  . doxepin (SINEQUAN) 10 MG capsule Take 10 mg by mouth at bedtime.    . fluorometholone (FML) 0.1 % ophthalmic suspension 1 drop in left eye three times a day  1  . FLUoxetine (PROZAC) 20 MG capsule Take 60 mg by mouth daily. Takes 40mg  in morning and 20mg  in evening.    . furosemide (LASIX) 20 MG tablet Take 60 mg by mouth daily.    Marland Kitchen gabapentin (NEURONTIN) 300 MG capsule Take 600 mg by mouth 3 (three) times daily.    . insulin glargine (LANTUS) 100 UNIT/ML injection Inject 15 Units into the skin 2 (two) times daily.     . insulin regular (HUMULIN R) 100 units/mL injection Inject 2-6 Units into the skin 3 (three) times daily before meals. Sliding scale    . isosorbide mononitrate (IMDUR) 60 MG 24 hr tablet TAKE 1 TABLET (60 MG TOTAL) BY MOUTH EVERY MORNING.  11  . Lifitegrast (XIIDRA) 5 % SOLN Apply 1 drop to eye 2 (two) times daily.    Marland Kitchen loteprednol (LOTEMAX) 0.5 % ophthalmic suspension     . Loteprednol Etabonate (LOTEMAX) 0.5 % GEL Place 1 drop into both eyes daily.     . methimazole (TAPAZOLE) 5 MG tablet Take 5 mg by mouth every morning.     . metoprolol tartrate (LOPRESSOR) 50 MG tablet Take 50 mg by mouth 2 (two) times daily.    . mupirocin ointment (BACTROBAN) 2 % Place 1 application into the nose 2 (two) times daily.    . nitroGLYCERIN (NITROSTAT) 0.4 MG SL tablet Place 0.4 mg under the tongue every 5 (five) minutes as needed for chest pain.    . potassium chloride (K-DUR) 10 MEQ tablet Take by mouth.    . pravastatin (PRAVACHOL) 80 MG tablet Take one tablet (80 mg total) by mouth daily.  11  . propranolol (INDERAL) 40 MG tablet Take 40 mg by mouth 2 (two) times daily.    Marland Kitchen pyridoxine (B-6) 100 MG tablet Take 100 mg by mouth daily.    . timolol (TIMOPTIC) 0.5 % ophthalmic solution Place 1 drop into both eyes 2 (two) times daily.    .  traMADol (ULTRAM) 50 MG tablet 1-2 tabs every 6 hrs for pain    . triamcinolone ointment (KENALOG) 0.1 % Apply to affected area twice per week (use Secura on all other days)    . valACYclovir (VALTREX) 500 MG tablet Take by mouth.    . Vitamin D, Ergocalciferol, (DRISDOL) 50000 units CAPS capsule TAKE 1 CAPSULE (50,000 UNITS TOTAL) BY MOUTH 2 (TWO) TIMES A WEEK. [facility] AND SUNDAY)    . warfarin (COUMADIN) 5 MG tablet Coumadin to 7.5 mg on Tuesday, Thursday, Saturday. Continue 5 mg on Monday, Wednesday, Friday, and Sunday.  6   No current facility-administered medications on  file prior to visit.    Allergies  Allergen Reactions  . Shrimp [Shellfish Allergy] Shortness Of Breath and Swelling    stated to her by allergist in past.   . Beef-Derived Products Other (See Comments)    Unknown, maybe swelling-patient cant recall at this time, stated to her by allergist in past.  . Benadryl [Diphenhydramine] Other (See Comments)    Causes her to fall asleep, also not able to wake her.    Corky Sox. Ceclor [Cefaclor]     Unknown   . Metformin And Related Nausea And Vomiting  . Other Itching    Unknown med at this time.   . Pork-Derived Products Other (See Comments)    Unknown, stated to her by allergist from past.    Social History   Social History  . Marital status: Married    Spouse name: N/A  . Number of children: N/A  . Years of education: N/A   Occupational History  . Not on file.   Social History Main Topics  . Smoking status: Former Games developermoker  . Smokeless tobacco: Former NeurosurgeonUser  . Alcohol use No  . Drug use: No  . Sexual activity: Not on file   Other Topics Concern  . Not on file   Social History Narrative  . No narrative on file      Review of Systems  Neurological: Positive for headaches.  All other systems reviewed and are negative.      Objective:   Physical Exam  Constitutional: She appears well-developed and well-nourished.  Cardiovascular: Normal rate and regular  rhythm.   Murmur heard. Pulmonary/Chest: Effort normal and breath sounds normal. No respiratory distress. She has no wheezes. She has no rales.  Abdominal: Soft. Bowel sounds are normal. She exhibits no distension. There is no tenderness. There is no rebound and no guarding.  Neurological: Coordination normal.  Vitals reviewed.         Assessment & Plan:  Severe headache - Plan: CT Head Wo Contrast  H/O aortic valve replacement - Plan: PT with INR/Fingerstick  Current use of long term anticoagulation - Plan: CT Head Wo Contrast  Fall, initial encounter - Plan: CT Head Wo Contrast INR is therapeutic. However I will send the patient for an urgent stat CAT scan of the brain to rule out intercranial hemorrhage. Some of the pain in her neck sounds muscular. However I believe we need imaging of the brain to be cautious.

## 2017-04-27 NOTE — Telephone Encounter (Signed)
Patient's son Sonya Campbell aware of results

## 2017-04-27 NOTE — Telephone Encounter (Signed)
Ct results

## 2017-04-27 NOTE — Addendum Note (Signed)
Addended by: Lynnea FerrierPICKARD, Marlene Pfluger T on: 04/27/2017 11:40 AM   Modules accepted: Orders

## 2017-05-08 ENCOUNTER — Encounter: Payer: Self-pay | Admitting: Physician Assistant

## 2017-05-08 ENCOUNTER — Ambulatory Visit (INDEPENDENT_AMBULATORY_CARE_PROVIDER_SITE_OTHER): Payer: Medicare Other | Admitting: Physician Assistant

## 2017-05-08 VITALS — BP 124/78 | HR 60 | Temp 97.8°F | Resp 16 | Wt 174.8 lb

## 2017-05-08 DIAGNOSIS — M546 Pain in thoracic spine: Secondary | ICD-10-CM

## 2017-05-08 MED ORDER — TRAMADOL HCL 50 MG PO TABS: ORAL_TABLET | ORAL | 1 refills | Status: DC

## 2017-05-08 NOTE — Progress Notes (Signed)
Patient ID: Sonya ShaggyLinda C Campbell MRN: [id number], DOB: 10-21-1946, 71 y.o. Date of Encounter: 05/08/2017, 4:31 PM    Chief Complaint:  Chief Complaint  Patient presents with  . Back Pain    since saturday     HPI: 71 y.o. year old female is here with her husband.  She states that she has been having some back pain that started on Saturday. States that she did no unusual activity that she can think of to have caused the back pain. Husband notes that she did clean the bathroom but says that this just entailed wiping off the counter and putting things away. Did no scrubbing or other strenuous activity.  Told patient that she can point to my back to indicate the areas were she is feeling pain. Points to bilateral shoulder blades and then runs her hands all the way down towards low back. Says that that is where she is feeling the pain. States that she has used heating pad to the area. Also has taken Tylenol every 6 hours but that does not seem to help at all. No other concerns.     Home Meds:   Outpatient Medications Prior to Visit  Medication Sig Dispense Refill  . acetaminophen (TYLENOL) 500 MG tablet Take 500 mg by mouth every 6 (six) hours as needed.    Marland Kitchen. aspirin 325 MG tablet Take 325 mg by mouth daily.    . brimonidine (ALPHAGAN) 0.15 % ophthalmic solution Place 1 drop into both eyes 3 (three) times a day.     . Cyanocobalamin (VITAMIN B 12 PO) Take 1,000 mcg by mouth daily.    Marland Kitchen. dicyclomine (BENTYL) 20 MG tablet Take 20 mg by mouth 2 (two) times daily.  5  . doxepin (SINEQUAN) 10 MG capsule Take 10 mg by mouth at bedtime.    . fluorometholone (FML) 0.1 % ophthalmic suspension 1 drop in left eye three times a day  1  . FLUoxetine (PROZAC) 20 MG capsule Take 60 mg by mouth daily. Takes 40mg  in morning and 20mg  in evening.    . furosemide (LASIX) 20 MG tablet Take 60 mg by mouth daily.    Marland Kitchen. gabapentin (NEURONTIN) 300 MG capsule Take 600 mg by mouth 3 (three) times daily.    . insulin  glargine (LANTUS) 100 UNIT/ML injection Inject 15 Units into the skin 2 (two) times daily.     . insulin regular (HUMULIN R) 100 units/mL injection Inject 2-6 Units into the skin 3 (three) times daily before meals. Sliding scale    . isosorbide mononitrate (IMDUR) 60 MG 24 hr tablet TAKE 1 TABLET (60 MG TOTAL) BY MOUTH EVERY MORNING.  11  . Lifitegrast (XIIDRA) 5 % SOLN Apply 1 drop to eye 2 (two) times daily.    Marland Kitchen. loteprednol (LOTEMAX) 0.5 % ophthalmic suspension     . Loteprednol Etabonate (LOTEMAX) 0.5 % GEL Place 1 drop into both eyes daily.     . methimazole (TAPAZOLE) 5 MG tablet Take 5 mg by mouth every morning.     . metoprolol tartrate (LOPRESSOR) 50 MG tablet Take 50 mg by mouth 2 (two) times daily.    . mupirocin ointment (BACTROBAN) 2 % Place 1 application into the nose 2 (two) times daily.    . nitroGLYCERIN (NITROSTAT) 0.4 MG SL tablet Place 0.4 mg under the tongue every 5 (five) minutes as needed for chest pain.    . potassium chloride (K-DUR) 10 MEQ tablet Take by mouth.    .Marland Kitchen  pravastatin (PRAVACHOL) 80 MG tablet Take one tablet (80 mg total) by mouth daily.  11  . propranolol (INDERAL) 40 MG tablet Take 40 mg by mouth 2 (two) times daily.    Marland Kitchen pyridoxine (B-6) 100 MG tablet Take 100 mg by mouth daily.    . timolol (TIMOPTIC) 0.5 % ophthalmic solution Place 1 drop into both eyes 2 (two) times daily.    . traMADol (ULTRAM) 50 MG tablet 1-2 tabs every 6 hrs for pain    . triamcinolone ointment (KENALOG) 0.1 % Apply to affected area twice per week (use Secura on all other days)    . valACYclovir (VALTREX) 500 MG tablet Take by mouth.    . Vitamin D, Ergocalciferol, (DRISDOL) 50000 units CAPS capsule TAKE 1 CAPSULE (50,000 UNITS TOTAL) BY MOUTH 2 (TWO) TIMES A WEEK. [facility] AND SUNDAY)    . warfarin (COUMADIN) 5 MG tablet Coumadin to 7.5 mg on Tuesday, Thursday, Saturday. Continue 5 mg on Monday, Wednesday, Friday, and Sunday.  6   No facility-administered medications prior to  visit.     Allergies:  Allergies  Allergen Reactions  . Shrimp [Shellfish Allergy] Shortness Of Breath and Swelling    stated to her by allergist in past.   . Beef-Derived Products Other (See Comments)    Unknown, maybe swelling-patient cant recall at this time, stated to her by allergist in past.  . Benadryl [Diphenhydramine] Other (See Comments)    Causes her to fall asleep, also not able to wake her.    Corky Sox [Cefaclor]     Unknown   . Metformin And Related Nausea And Vomiting  . Other Itching    Unknown med at this time.   . Pork-Derived Products Other (See Comments)    Unknown, stated to her by allergist from past.       Review of Systems: See HPI for pertinent ROS. All other ROS negative.    Physical Exam: Blood pressure 124/78, pulse 60, temperature 97.8 F (36.6 C), temperature source Oral, resp. rate 16, weight 174 lb 12.8 oz (79.3 kg), SpO2 98 %., Body mass index is 31.97 kg/m. General:  Obese WF. Appears in no acute distress. Neck: Supple. No thyromegaly. No lymphadenopathy. Lungs: Clear bilaterally to auscultation without wheezes, rales, or rhonchi. Breathing is unlabored. Heart: Regular rhythm. No murmurs, rubs, or gallops. Msk:  She is unable to get her hand wrapped around her back to indicate areas of pain. Therefore I had her point to my back to indicate areas. She puts her hands on my shoulder blade area bilaterally and then pulls her hands down to my low back bilaterally and says that the pain covers all of that area. When I palpate her scapular region bilaterally,  this is very painful and reproduces the pain she has been feeling. When I palpate her low back bilaterally this also causes significant pain and reproduces the pain she has been feeling. Extremities/Skin: Warm and dry.  Neuro: Alert and oriented X 3. Moves all extremities spontaneously. Gait is normal. CNII-XII grossly in tact. Psych:  Responds to questions appropriately with a normal affect.      ASSESSMENT AND PLAN:  72 y.o. year old female with  1. Acute bilateral thoracic back pain Told her to continue heating pad. Also can use warm water in the shower. Also gently stretch doing range of motion with the neck and shoulders throughout the day. She is on Coumadin. Therefore will use tramadol for pain. - traMADol (ULTRAM) 50 MG tablet;  Take 1 -2 every 8 hours as needed for pain  Dispense: 90 tablet; Refill: 1   Signed, [address] [place], [place], [facility] 05/08/2017 4:31 PM

## 2017-05-15 ENCOUNTER — Encounter: Payer: Self-pay | Admitting: Family Medicine

## 2017-05-15 ENCOUNTER — Ambulatory Visit (INDEPENDENT_AMBULATORY_CARE_PROVIDER_SITE_OTHER): Payer: Medicare Other | Admitting: Family Medicine

## 2017-05-15 DIAGNOSIS — Z952 Presence of prosthetic heart valve: Secondary | ICD-10-CM | POA: Diagnosis not present

## 2017-05-15 LAB — PT WITH INR/FINGERSTICK
INR, fingerstick: 4.8 — ABNORMAL HIGH (ref 0.9–1.1)
PT FINGERSTICK: 57.9 s — AB (ref 10.5–13.1)

## 2017-05-15 NOTE — Progress Notes (Signed)
Subjective:    Patient ID: Sonya Campbell, female    DOB: May 14, 1946, 71 y.o.   MRN: [id number]  Headache      04/13/17 Patient is a very pleasant 71 year old white female with a very complicated past medical history. Her primary care physician is Dr. Rejeana Brock in [facility].  He manages her insulin-dependent diabetes mellitus, her hyperthyroidism, her depression, fibromyalgia, her peripheral neuropathy, and her hyperlipidemia area and she also has a history of coronary artery disease status post CABG that was performed 5 years ago. She sees a cardiologist in [place] as well. She has a history of an aortic valve replacement with bovine valve. Recently on an echocardiogram, the cardiologist came concerned about a possible vegetation on the valve. He wanted to try the patient on Coumadin for 6-8 months to see if this would regress spontaneously versus requiring surgery. Patient lives close to the office and wanted to have a doctor) to check her Coumadin rather than have to drive to [place]. She was still prefer to see her regular doctor since she has an established relationship with for her chronic medical conditions. She would like simply to check her Coumadin for the next 6-8 weeks to avoid the long commute. The remainder of her preventative care including her immunizations, cancer screening are performed at her primary care physician in [place].  She has not yet started Coumadin.  At that time, my plan was: Patient wants to maintain her current relationship with her primary care physician and her cardiologist which I certainly support. Therefore we will serve only to monitor her Coumadin to avoid polypharmacy and making any medication changes that Sieh negatively impact her care. I will check an INR today. I would like to get a baseline CBC as well as a CMP to evaluate for any anemia or liver dysfunction. Start Coumadin 5 mg a day and recheck INR next week. Titrate Coumadin to achieve an INR  between 2.5 and 3.5. We discussed dietary restrictions on Coumadin. We discussed warning signs of a GI bleed. Recheck the patient next week  04/20/17 INR is subtherapeutic at 1.5. Currently on 5 mg a day of Coumadin. Denies any side effects.  AT that time, my plan was:  Increase dosage of Coumadin to 7.5 mg on Tuesday, Thursday, Saturday. Continue 5 mg on Monday, Wednesday, Friday, and Sunday. Recheck INR next Thursday.  04/27/17 INR today is therapeutic at 2.6. She is currently on 5 mg every day except Tuesday Thursday and Saturday when she takes 7.5 mg. However she is reporting a severe headache in her right occiput. Headache began after she fell and hit her head on the counter in her home while pushing her wheelchair down the hall. The area where she has the headache is the exact area where she struck the counter. She denies any neurologic deficit. Pain is somewhat reproducible in her right trapezius however I am unable to reproduce the pain by palpation in the right occiput. Patient states the headache feels like it spreading and is gradually worsening.  At that time, my plan was: INR is therapeutic. However I will send the patient for an urgent stat CAT scan of the brain to rule out intercranial hemorrhage. Some of the pain in her neck sounds muscular. However I believe we need imaging of the brain to be cautious.  05/15/17 Patient's INR today is supratherapeutic at 4.8. She denies any recent changes in her medication other than the fact she started tramadol for back pain. She is  currently taking 5 mg every day except Tuesday Thursday and Saturday when she takes 7.5 mg. She denies any bleeding or bruising. However on Saturday, the patient states that she fell on 3 or 4 separate occasions. She states that her feet became tangled in her walker and she fell. She denies any vertigo. She denies any headache. She denies any injury to the head or head contusions since the last time I saw the patient we obtain a  normal CAT scan. Her tramadol. Sunday her symptoms went away. She is experienced no further falling episodes. Today in clinic, cranial nerves II through XII are grossly intact with muscle strength 5 over 5 equal and symmetric in the upper and lower extremities. Cerebellar exam is normal. Also ambulated the patient over 120 feet in our clinic. There was no evidence of ataxia. She was able to walk without a cane. Past Medical History:  Diagnosis Date  . CAD (coronary artery disease)   . Chronic kidney disease    stage 3  . Depression   . Diabetes mellitus without complication (HCC)   . Fibromyalgia   . Hyperlipidemia   . Hyperthyroidism   . 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 CORONARY ARTERY BYPASS GRAFT     Current Outpatient Prescriptions on File Prior to Visit  Medication Sig Dispense Refill  . acetaminophen (TYLENOL) 500 MG tablet Take 500 mg by mouth every 6 (six) hours as needed.    Marland Kitchen. aspirin 325 MG tablet Take 325 mg by mouth daily.    . brimonidine (ALPHAGAN) 0.15 % ophthalmic solution Place 1 drop into both eyes 3 (three) times a day.     . Cyanocobalamin (VITAMIN B 12 PO) Take 1,000 mcg by mouth daily.    Marland Kitchen. dicyclomine (BENTYL) 20 MG tablet Take 20 mg by mouth 2 (two) times daily.  5  . doxepin (SINEQUAN) 10 MG capsule Take 10 mg by mouth at bedtime.    . fluorometholone (FML) 0.1 % ophthalmic suspension 1 drop in left eye three times a day  1  . FLUoxetine (PROZAC) 20 MG capsule Take 60 mg by mouth daily. Takes 40mg  in morning and 20mg  in evening.    . furosemide (LASIX) 20 MG tablet Take 60 mg by mouth daily.    Marland Kitchen. gabapentin (NEURONTIN) 300 MG capsule Take 600 mg by mouth 3 (three) times daily.    . insulin glargine (LANTUS) 100 UNIT/ML injection Inject 15 Units into the skin 2 (two)  times daily.     . insulin regular (HUMULIN R) 100 units/mL injection Inject 2-6 Units into the skin 3 (three) times daily before meals. Sliding scale    . isosorbide mononitrate (IMDUR) 60 MG 24 hr tablet TAKE 1 TABLET (60 MG TOTAL) BY MOUTH EVERY MORNING.  11  . Lifitegrast (XIIDRA) 5 % SOLN Apply 1 drop to eye 2 (two) times daily.    Marland Kitchen. loteprednol (LOTEMAX) 0.5 % ophthalmic suspension     . Loteprednol Etabonate (LOTEMAX) 0.5 % GEL Place 1 drop into both eyes daily.     . methimazole (TAPAZOLE) 5 MG tablet Take 5 mg by mouth every morning.     . metoprolol tartrate (LOPRESSOR) 50 MG tablet Take 50 mg by mouth 2 (two) times daily.    . mupirocin ointment (BACTROBAN)  2 % Place 1 application into the nose 2 (two) times daily.    . nitroGLYCERIN (NITROSTAT) 0.4 MG SL tablet Place 0.4 mg under the tongue every 5 (five) minutes as needed for chest pain.    . potassium chloride (K-DUR) 10 MEQ tablet Take by mouth.    . pravastatin (PRAVACHOL) 80 MG tablet Take one tablet (80 mg total) by mouth daily.  11  . propranolol (INDERAL) 40 MG tablet Take 40 mg by mouth 2 (two) times daily.    Marland Kitchen pyridoxine (B-6) 100 MG tablet Take 100 mg by mouth daily.    . timolol (TIMOPTIC) 0.5 % ophthalmic solution Place 1 drop into both eyes 2 (two) times daily.    . traMADol (ULTRAM) 50 MG tablet 1-2 tabs every 6 hrs for pain    . traMADol (ULTRAM) 50 MG tablet Take 1 -2 every 8 hours as needed for pain 90 tablet 1  . triamcinolone ointment (KENALOG) 0.1 % Apply to affected area twice per week (use Secura on all other days)    . valACYclovir (VALTREX) 500 MG tablet Take by mouth.    . Vitamin D, Ergocalciferol, (DRISDOL) 50000 units CAPS capsule TAKE 1 CAPSULE (50,000 UNITS TOTAL) BY MOUTH 2 (TWO) TIMES A WEEK. [facility] AND SUNDAY)    . warfarin (COUMADIN) 5 MG tablet Coumadin to 7.5 mg on Tuesday, Thursday, Saturday. Continue 5 mg on Monday, Wednesday, Friday, and Sunday.  6   No current facility-administered  medications on file prior to visit.    Allergies  Allergen Reactions  . Shrimp [Shellfish Allergy] Shortness Of Breath and Swelling    stated to her by allergist in past.   . Beef-Derived Products Other (See Comments)    Unknown, maybe swelling-patient cant recall at this time, stated to her by allergist in past.  . Benadryl [Diphenhydramine] Other (See Comments)    Causes her to fall asleep, also not able to wake her.    Corky Sox [Cefaclor]     Unknown   . Metformin And Related Nausea And Vomiting  . Other Itching    Unknown med at this time.   . Pork-Derived Products Other (See Comments)    Unknown, stated to her by allergist from past.    Social History   Social History  . Marital status: Married    Spouse name: N/A  . Number of children: N/A  . Years of education: N/A   Occupational History  . Not on file.   Social History Main Topics  . Smoking status: Former Games developer  . Smokeless tobacco: Former Neurosurgeon  . Alcohol use No  . Drug use: No  . Sexual activity: Not on file   Other Topics Concern  . Not on file   Social History Narrative  . No narrative on file      Review of Systems  Neurological: Positive for headaches.  All other systems reviewed and are negative.      Objective:   Physical Exam  Constitutional: She appears well-developed and well-nourished.  Cardiovascular: Normal rate and regular rhythm.   Murmur heard. Pulmonary/Chest: Effort normal and breath sounds normal. No respiratory distress. She has no wheezes. She has no rales.  Abdominal: Soft. Bowel sounds are normal. She exhibits no distension. There is no tenderness. There is no rebound and no guarding.  Neurological: She is alert. She has normal reflexes. She displays normal reflexes. No cranial nerve deficit. She exhibits normal muscle tone. Coordination normal.  Vitals reviewed.  Assessment & Plan:  H/O aortic valve replacement - Plan: PT with INR/Fingerstick I have asked  the patient to stop taking Coumadin. We will recheck her INR on Thursday. Once INR has fallen below 3, will resume Coumadin albeit at a reduced dose likely 5 mg a day and 2-1/2 mg on Mondays and Fridays. I see no evidence of an intracranial bleed or stroke as a cause of her falls. I believe is due to polypharmacy. I recommended that she discontinue tramadol. Her neurologic exam today is completely normal. She is not having any headaches. Will reassess the patient on Thursday. If symptoms return or worsen, she needs immediate imaging of the brain. I explained this to the patient and her husband

## 2017-05-18 ENCOUNTER — Encounter: Payer: Self-pay | Admitting: Family Medicine

## 2017-05-18 ENCOUNTER — Ambulatory Visit (INDEPENDENT_AMBULATORY_CARE_PROVIDER_SITE_OTHER): Payer: Medicare Other | Admitting: Family Medicine

## 2017-05-18 VITALS — BP 120/58 | HR 68 | Temp 98.5°F | Resp 16 | Ht 62.0 in | Wt 177.0 lb

## 2017-05-18 DIAGNOSIS — R410 Disorientation, unspecified: Secondary | ICD-10-CM

## 2017-05-18 DIAGNOSIS — Z952 Presence of prosthetic heart valve: Secondary | ICD-10-CM

## 2017-05-18 LAB — URINALYSIS, MICROSCOPIC ONLY
Bacteria, UA: NONE SEEN [HPF]
CRYSTALS: NONE SEEN [HPF]
Casts: NONE SEEN [LPF]
WBC UA: NONE SEEN WBC/HPF (ref <=5)
Yeast: NONE SEEN [HPF]

## 2017-05-18 LAB — URINALYSIS, ROUTINE W REFLEX MICROSCOPIC
BILIRUBIN URINE: NEGATIVE
Leukocytes, UA: NEGATIVE
NITRITE: NEGATIVE
SPECIFIC GRAVITY, URINE: 1.02 (ref 1.001–1.035)
pH: 5 (ref 5.0–8.0)

## 2017-05-18 LAB — PT WITH INR/FINGERSTICK
INR FINGERSTICK: 1.3 — AB (ref 0.9–1.1)
PT, fingerstick: 15.6 seconds — ABNORMAL HIGH (ref 10.5–13.1)

## 2017-05-18 NOTE — Progress Notes (Signed)
Subjective:    Patient ID: Sonya Campbell, female    DOB: [birth year], 71 y.o.   MRN: [id number]  Headache      04/13/17 Patient is a very pleasant 71 year old white female with a very complicated past medical history. Her primary care physician is Dr. Rejeana Brock in [facility].  He manages her insulin-dependent diabetes mellitus, her hyperthyroidism, her depression, fibromyalgia, her peripheral neuropathy, and her hyperlipidemia area and she also has a history of coronary artery disease status post CABG that was performed 5 years ago. She sees a cardiologist in [place] as well. She has a history of an aortic valve replacement with bovine valve. Recently on an echocardiogram, the cardiologist came concerned about a possible vegetation on the valve. He wanted to try the patient on Coumadin for 6-8 months to see if this would regress spontaneously versus requiring surgery. Patient lives close to the office and wanted to have a doctor) to check her Coumadin rather than have to drive to [place]. She was still prefer to see her regular doctor since she has an established relationship with for her chronic medical conditions. She would like simply to check her Coumadin for the next 6-8 weeks to avoid the long commute. The remainder of her preventative care including her immunizations, cancer screening are performed at her primary care physician in [place].  She has not yet started Coumadin.  At that time, my plan was: Patient wants to maintain her current relationship with her primary care physician and her cardiologist which I certainly support. Therefore we will serve only to monitor her Coumadin to avoid polypharmacy and making any medication changes that Kalas negatively impact her care. I will check an INR today. I would like to get a baseline CBC as well as a CMP to evaluate for any anemia or liver dysfunction. Start Coumadin 5 mg a day and recheck INR next week. Titrate Coumadin to achieve an INR  between 2.5 and 3.5. We discussed dietary restrictions on Coumadin. We discussed warning signs of a GI bleed. Recheck the patient next week  04/20/17 INR is subtherapeutic at 1.5. Currently on 5 mg a day of Coumadin. Denies any side effects.  AT that time, my plan was:  Increase dosage of Coumadin to 7.5 mg on Tuesday, Thursday, Saturday. Continue 5 mg on Monday, Wednesday, Friday, and Sunday. Recheck INR next Thursday.  04/27/17 INR today is therapeutic at 2.6. She is currently on 5 mg every day except Tuesday Thursday and Saturday when she takes 7.5 mg. However she is reporting a severe headache in her right occiput. Headache began after she fell and hit her head on the counter in her home while pushing her wheelchair down the hall. The area where she has the headache is the exact area where she struck the counter. She denies any neurologic deficit. Pain is somewhat reproducible in her right trapezius however I am unable to reproduce the pain by palpation in the right occiput. Patient states the headache feels like it spreading and is gradually worsening.  At that time, my plan was: INR is therapeutic. However I will send the patient for an urgent stat CAT scan of the brain to rule out intercranial hemorrhage. Some of the pain in her neck sounds muscular. However I believe we need imaging of the brain to be cautious.  05/15/17 Patient's INR today is supratherapeutic at 4.8. She denies any recent changes in her medication other than the fact she started tramadol for back pain. She is  currently taking 5 mg every day except Tuesday Thursday and Saturday when she takes 7.5 mg. She denies any bleeding or bruising. However on Saturday, the patient states that she fell on 3 or 4 separate occasions. She states that her feet became tangled in her walker and she fell. She denies any vertigo. She denies any headache. She denies any injury to the head or head contusions since the last time I saw the patient we obtain a  normal CAT scan. Her tramadol. Sunday her symptoms went away. She is experienced no further falling episodes. Today in clinic, cranial nerves II through XII are grossly intact with muscle strength 5 over 5 equal and symmetric in the upper and lower extremities. Cerebellar exam is normal. Also ambulated the patient over 120 feet in our clinic. There was no evidence of ataxia. She was able to walk without a cane.  At that time, my plan was: I have asked the patient to stop taking Coumadin. We will recheck her INR on Thursday. Once INR has fallen below 3, will resume Coumadin albeit at a reduced dose likely 5 mg a day and 2-1/2 mg on Mondays and Fridays. I see no evidence of an intracranial bleed or stroke as a cause of her falls. I believe is due to polypharmacy. I recommended that she discontinue tramadol. Her neurologic exam today is completely normal. She is not having any headaches. Will reassess the patient on Thursday. If symptoms return or worsen, she needs immediate imaging of the brain. I explained this to the patient and her husband  05/18/17 INR has fallen to 1.3.  However, patient has fallen twice more since I last saw her.  I have limited experience with her baseline, but today on exam she is ataxic.  Patient almost falls on Rhomberg test but is able to perform finger to nose testing without difficulty.  This is different from earlier this week.  She is also slightly lethargic and on MMSE performed 1/3 on recall, fails serial sevens.  I called her PCP, Dr. Griffith CitronPettit for further background.  He states that she has a history of ataxia, periods of lethargy with UTI, hyperglycemia, but that these spells are not uncommon for her.   Her fingerstick capillary blood sugar was 200. Urinalysis was significant for blood but no leukocyte esterase or nitrites. It did show ketones suggesting dehydration.  Past Medical History:  Diagnosis Date  . CAD (coronary artery disease)   . Chronic kidney disease    stage 3    . Depression   . Diabetes mellitus without complication (HCC)   . Fibromyalgia   . Hyperlipidemia   . Hyperthyroidism   . 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 CORONARY ARTERY BYPASS GRAFT     Current Outpatient Prescriptions on File Prior to Visit  Medication Sig Dispense Refill  . acetaminophen (TYLENOL) 500 MG tablet Take 500 mg by mouth every 6 (six) hours as needed.    Marland Kitchen. aspirin 325 MG tablet Take 325 mg by mouth daily.    . brimonidine (ALPHAGAN) 0.15 % ophthalmic solution Place 1 drop into both eyes 3 (three) times a day.     . Cyanocobalamin (VITAMIN B 12 PO) Take 1,000 mcg by mouth daily.    Marland Kitchen. dicyclomine (BENTYL) 20 MG tablet Take 20  mg by mouth 2 (two) times daily.  5  . doxepin (SINEQUAN) 10 MG capsule Take 10 mg by mouth at bedtime.    . fluorometholone (FML) 0.1 % ophthalmic suspension 1 drop in left eye three times a day  1  . FLUoxetine (PROZAC) 20 MG capsule Take 60 mg by mouth daily. Takes 40mg  in morning and 20mg  in evening.    . furosemide (LASIX) 20 MG tablet Take 60 mg by mouth daily.    Marland Kitchen gabapentin (NEURONTIN) 300 MG capsule Take 600 mg by mouth 3 (three) times daily.    . insulin glargine (LANTUS) 100 UNIT/ML injection Inject 15 Units into the skin 2 (two) times daily.     . insulin regular (HUMULIN R) 100 units/mL injection Inject 2-6 Units into the skin 3 (three) times daily before meals. Sliding scale    . isosorbide mononitrate (IMDUR) 60 MG 24 hr tablet TAKE 1 TABLET (60 MG TOTAL) BY MOUTH EVERY MORNING.  11  . Lifitegrast (XIIDRA) 5 % SOLN Apply 1 drop to eye 2 (two) times daily.    Marland Kitchen loteprednol (LOTEMAX) 0.5 % ophthalmic suspension     . Loteprednol Etabonate (LOTEMAX) 0.5 % GEL Place 1 drop into both eyes daily.     . methimazole (TAPAZOLE) 5 MG tablet Take 5  mg by mouth every morning.     . metoprolol tartrate (LOPRESSOR) 50 MG tablet Take 50 mg by mouth 2 (two) times daily.    . mupirocin ointment (BACTROBAN) 2 % Place 1 application into the nose 2 (two) times daily.    . nitroGLYCERIN (NITROSTAT) 0.4 MG SL tablet Place 0.4 mg under the tongue every 5 (five) minutes as needed for chest pain.    . potassium chloride (K-DUR) 10 MEQ tablet Take by mouth.    . pravastatin (PRAVACHOL) 80 MG tablet Take one tablet (80 mg total) by mouth daily.  11  . propranolol (INDERAL) 40 MG tablet Take 40 mg by mouth 2 (two) times daily.    Marland Kitchen pyridoxine (B-6) 100 MG tablet Take 100 mg by mouth daily.    . timolol (TIMOPTIC) 0.5 % ophthalmic solution Place 1 drop into both eyes 2 (two) times daily.    . traMADol (ULTRAM) 50 MG tablet 1-2 tabs every 6 hrs for pain    . traMADol (ULTRAM) 50 MG tablet Take 1 -2 every 8 hours as needed for pain 90 tablet 1  . triamcinolone ointment (KENALOG) 0.1 % Apply to affected area twice per week (use Secura on all other days)    . valACYclovir (VALTREX) 500 MG tablet Take by mouth.    . Vitamin D, Ergocalciferol, (DRISDOL) 50000 units CAPS capsule TAKE 1 CAPSULE (50,000 UNITS TOTAL) BY MOUTH 2 (TWO) TIMES A WEEK. [facility] AND SUNDAY)    . warfarin (COUMADIN) 5 MG tablet Coumadin to 7.5 mg on Tuesday, Thursday, Saturday. Continue 5 mg on Monday, Wednesday, Friday, and Sunday.  6   No current facility-administered medications on file prior to visit.    Allergies  Allergen Reactions  . Shrimp [Shellfish Allergy] Shortness Of Breath and Swelling    stated to her by allergist in past.   . Beef-Derived Products Other (See Comments)    Unknown, maybe swelling-patient cant recall at this time, stated to her by allergist in past.  . Benadryl [Diphenhydramine] Other (See Comments)    Causes her to fall asleep, also not able to wake her.    Corky Sox [Cefaclor]     Unknown   .  Metformin And Related Nausea And Vomiting  . Other  Itching    Unknown med at this time.   . Pork-Derived Products Other (See Comments)    Unknown, stated to her by allergist from past.    Social History   Social History  . Marital status: Married    Spouse name: N/A  . Number of children: N/A  . Years of education: N/A   Occupational History  . Not on file.   Social History Main Topics  . Smoking status: Former Games developer  . Smokeless tobacco: Former Neurosurgeon  . Alcohol use No  . Drug use: No  . Sexual activity: Not on file   Other Topics Concern  . Not on file   Social History Narrative  . No narrative on file      Review of Systems  Neurological: Positive for headaches.  All other systems reviewed and are negative.      Objective:   Physical Exam  Constitutional: She appears well-developed and well-nourished. She appears lethargic.  Cardiovascular: Normal rate and regular rhythm.   Murmur heard. Pulmonary/Chest: Effort normal and breath sounds normal. No respiratory distress. She has no wheezes. She has no rales.  Abdominal: Soft. Bowel sounds are normal. She exhibits no distension. There is no tenderness. There is no rebound and no guarding.  Neurological: She has normal reflexes. She appears lethargic. She is not disoriented. She displays no tremor. No cranial nerve deficit or sensory deficit. She exhibits normal muscle tone. Coordination abnormal.  Vitals reviewed.         Assessment & Plan:  Disorientation - Plan: CBC with Differential/Platelet, COMPLETE METABOLIC PANEL WITH GFR, Urinalysis, Routine w reflex microscopic  H/O aortic valve replacement - Plan: PT with INR/Fingerstick Patient appears to be at her baseline according to my discussion with her primary care physician. Therefore we have a decision to make about regarding anticoagulation. At present she is subtherapeutic with an INR of 1.3. Her primary care physician was gracious and agreed to work her in either tomorrow or early next week. If he feels  that she is at her baseline and they determine together that is best for her to resume Coumadin, I will have her resume Coumadin 5 mg a day and recheck an INR in one week.

## 2017-05-19 LAB — CBC WITH DIFFERENTIAL/PLATELET
Basophils Absolute: 108 cells/uL (ref 0–200)
Basophils Relative: 1 %
EOS ABS: 540 {cells}/uL — AB (ref 15–500)
Eosinophils Relative: 5 %
HEMATOCRIT: 34 % — AB (ref 35.0–45.0)
Hemoglobin: 11.1 g/dL — ABNORMAL LOW (ref 12.0–15.0)
LYMPHS PCT: 22 %
Lymphs Abs: 2376 cells/uL (ref 850–3900)
MCH: 31.8 pg (ref 27.0–33.0)
MCHC: 32.6 g/dL (ref 32.0–36.0)
MCV: 97.4 fL (ref 80.0–100.0)
MONO ABS: 972 {cells}/uL — AB (ref 200–950)
MONOS PCT: 9 %
MPV: 10.1 fL (ref 7.5–12.5)
NEUTROS PCT: 63 %
Neutro Abs: 6804 cells/uL (ref 1500–7800)
PLATELETS: 395 10*3/uL (ref 140–400)
RBC: 3.49 MIL/uL — ABNORMAL LOW (ref 3.80–5.10)
RDW: 14 % (ref 11.0–15.0)
WBC: 10.8 10*3/uL (ref 3.8–10.8)

## 2017-05-19 LAB — COMPLETE METABOLIC PANEL WITH GFR
ALT: 15 U/L (ref 6–29)
AST: 16 U/L (ref 10–35)
Albumin: 3.9 g/dL (ref 3.6–5.1)
Alkaline Phosphatase: 145 U/L — ABNORMAL HIGH (ref 33–130)
BUN: 35 mg/dL — ABNORMAL HIGH (ref 7–25)
CALCIUM: 9 mg/dL (ref 8.6–10.4)
CHLORIDE: 100 mmol/L (ref 98–110)
CO2: 23 mmol/L (ref 20–31)
Creat: 1.8 mg/dL — ABNORMAL HIGH (ref 0.60–0.93)
GFR, EST AFRICAN AMERICAN: 32 mL/min — AB (ref >=60)
GFR, EST NON AFRICAN AMERICAN: 28 mL/min — AB (ref >=60)
Glucose, Bld: 226 mg/dL — ABNORMAL HIGH (ref 70–99)
POTASSIUM: 4.4 mmol/L (ref 3.5–5.3)
Sodium: 134 mmol/L — ABNORMAL LOW (ref 135–146)
Total Bilirubin: 0.4 mg/dL (ref 0.2–1.2)
Total Protein: 6.5 g/dL (ref 6.1–8.1)

## 2017-06-06 ENCOUNTER — Telehealth: Payer: Self-pay | Admitting: *Deleted

## 2017-06-06 NOTE — Telephone Encounter (Signed)
Noted.   Pharmacy to be made aware.

## 2017-06-06 NOTE — Telephone Encounter (Signed)
Needs to come from her pcp.  We were only managing coumadin and that has been stopped

## 2017-06-06 NOTE — Telephone Encounter (Signed)
Received fax requesting refill on Tramadol.   Ok to refill??  Last office visit 05/18/2017.  Last refill 05/08/2017, #1 refills.

## 2017-08-06 ENCOUNTER — Encounter (HOSPITAL_COMMUNITY): Payer: Self-pay | Admitting: Emergency Medicine

## 2017-08-06 ENCOUNTER — Inpatient Hospital Stay (HOSPITAL_COMMUNITY)
Admission: EM | Admit: 2017-08-06 | Discharge: 2017-08-13 | DRG: 480 | Disposition: A | Discharge status: Skilled Nursing Facility | Payer: Medicare Other | Attending: Nephrology | Admitting: Nephrology

## 2017-08-06 DIAGNOSIS — Z6837 Body mass index (BMI) 37.0-37.9, adult: Secondary | ICD-10-CM

## 2017-08-06 DIAGNOSIS — Z7901 Long term (current) use of anticoagulants: Secondary | ICD-10-CM

## 2017-08-06 DIAGNOSIS — T40605A Adverse effect of unspecified narcotics, initial encounter: Secondary | ICD-10-CM | POA: Diagnosis not present

## 2017-08-06 DIAGNOSIS — S7290XA Unspecified fracture of unspecified femur, initial encounter for closed fracture: Secondary | ICD-10-CM

## 2017-08-06 DIAGNOSIS — S72431A Displaced fracture of medial condyle of right femur, initial encounter for closed fracture: Secondary | ICD-10-CM

## 2017-08-06 DIAGNOSIS — E0590 Thyrotoxicosis, unspecified without thyrotoxic crisis or storm: Secondary | ICD-10-CM | POA: Diagnosis present

## 2017-08-06 DIAGNOSIS — E1122 Type 2 diabetes mellitus with diabetic chronic kidney disease: Secondary | ICD-10-CM

## 2017-08-06 DIAGNOSIS — Z87891 Personal history of nicotine dependence: Secondary | ICD-10-CM

## 2017-08-06 DIAGNOSIS — M797 Fibromyalgia: Secondary | ICD-10-CM | POA: Diagnosis present

## 2017-08-06 DIAGNOSIS — R509 Fever, unspecified: Secondary | ICD-10-CM

## 2017-08-06 DIAGNOSIS — Y92010 Kitchen of single-family (private) house as the place of occurrence of the external cause: Secondary | ICD-10-CM

## 2017-08-06 DIAGNOSIS — Z8052 Family history of malignant neoplasm of bladder: Secondary | ICD-10-CM

## 2017-08-06 DIAGNOSIS — Z951 Presence of aortocoronary bypass graft: Secondary | ICD-10-CM

## 2017-08-06 DIAGNOSIS — K219 Gastro-esophageal reflux disease without esophagitis: Secondary | ICD-10-CM | POA: Diagnosis present

## 2017-08-06 DIAGNOSIS — Z23 Encounter for immunization: Secondary | ICD-10-CM

## 2017-08-06 DIAGNOSIS — S72451A Displaced supracondylar fracture without intracondylar extension of lower end of right femur, initial encounter for closed fracture: Principal | ICD-10-CM | POA: Diagnosis present

## 2017-08-06 DIAGNOSIS — D72829 Elevated white blood cell count, unspecified: Secondary | ICD-10-CM | POA: Diagnosis present

## 2017-08-06 DIAGNOSIS — E1165 Type 2 diabetes mellitus with hyperglycemia: Secondary | ICD-10-CM | POA: Diagnosis present

## 2017-08-06 DIAGNOSIS — N183 Chronic kidney disease, stage 3 unspecified: Secondary | ICD-10-CM | POA: Diagnosis present

## 2017-08-06 DIAGNOSIS — E1129 Type 2 diabetes mellitus with other diabetic kidney complication: Secondary | ICD-10-CM | POA: Insufficient documentation

## 2017-08-06 DIAGNOSIS — Z950 Presence of cardiac pacemaker: Secondary | ICD-10-CM

## 2017-08-06 DIAGNOSIS — Z952 Presence of prosthetic heart valve: Secondary | ICD-10-CM

## 2017-08-06 DIAGNOSIS — F329 Major depressive disorder, single episode, unspecified: Secondary | ICD-10-CM | POA: Diagnosis present

## 2017-08-06 DIAGNOSIS — S72401A Unspecified fracture of lower end of right femur, initial encounter for closed fracture: Secondary | ICD-10-CM | POA: Diagnosis present

## 2017-08-06 DIAGNOSIS — M25551 Pain in right hip: Secondary | ICD-10-CM | POA: Diagnosis not present

## 2017-08-06 DIAGNOSIS — M546 Pain in thoracic spine: Secondary | ICD-10-CM

## 2017-08-06 DIAGNOSIS — W19XXXA Unspecified fall, initial encounter: Secondary | ICD-10-CM | POA: Diagnosis present

## 2017-08-06 DIAGNOSIS — I5032 Chronic diastolic (congestive) heart failure: Secondary | ICD-10-CM | POA: Diagnosis present

## 2017-08-06 DIAGNOSIS — G629 Polyneuropathy, unspecified: Secondary | ICD-10-CM

## 2017-08-06 DIAGNOSIS — W010XXA Fall on same level from slipping, tripping and stumbling without subsequent striking against object, initial encounter: Secondary | ICD-10-CM | POA: Diagnosis present

## 2017-08-06 DIAGNOSIS — F0390 Unspecified dementia without behavioral disturbance: Secondary | ICD-10-CM | POA: Diagnosis present

## 2017-08-06 DIAGNOSIS — G9341 Metabolic encephalopathy: Secondary | ICD-10-CM

## 2017-08-06 DIAGNOSIS — I2510 Atherosclerotic heart disease of native coronary artery without angina pectoris: Secondary | ICD-10-CM | POA: Diagnosis present

## 2017-08-06 DIAGNOSIS — Z9889 Other specified postprocedural states: Secondary | ICD-10-CM

## 2017-08-06 DIAGNOSIS — E876 Hypokalemia: Secondary | ICD-10-CM

## 2017-08-06 DIAGNOSIS — Z794 Long term (current) use of insulin: Secondary | ICD-10-CM

## 2017-08-06 DIAGNOSIS — Z7982 Long term (current) use of aspirin: Secondary | ICD-10-CM

## 2017-08-06 DIAGNOSIS — Z91013 Allergy to seafood: Secondary | ICD-10-CM

## 2017-08-06 DIAGNOSIS — I9581 Postprocedural hypotension: Secondary | ICD-10-CM | POA: Diagnosis not present

## 2017-08-06 DIAGNOSIS — R739 Hyperglycemia, unspecified: Secondary | ICD-10-CM

## 2017-08-06 DIAGNOSIS — I130 Hypertensive heart and chronic kidney disease with heart failure and stage 1 through stage 4 chronic kidney disease, or unspecified chronic kidney disease: Secondary | ICD-10-CM | POA: Diagnosis present

## 2017-08-06 DIAGNOSIS — Z79899 Other long term (current) drug therapy: Secondary | ICD-10-CM

## 2017-08-06 DIAGNOSIS — S72421A Displaced fracture of lateral condyle of right femur, initial encounter for closed fracture: Secondary | ICD-10-CM

## 2017-08-06 DIAGNOSIS — E1142 Type 2 diabetes mellitus with diabetic polyneuropathy: Secondary | ICD-10-CM | POA: Diagnosis present

## 2017-08-06 DIAGNOSIS — Z8781 Personal history of (healed) traumatic fracture: Secondary | ICD-10-CM

## 2017-08-06 DIAGNOSIS — E785 Hyperlipidemia, unspecified: Secondary | ICD-10-CM | POA: Diagnosis present

## 2017-08-06 DIAGNOSIS — E119 Type 2 diabetes mellitus without complications: Secondary | ICD-10-CM

## 2017-08-06 DIAGNOSIS — E11649 Type 2 diabetes mellitus with hypoglycemia without coma: Secondary | ICD-10-CM | POA: Diagnosis present

## 2017-08-06 DIAGNOSIS — D62 Acute posthemorrhagic anemia: Secondary | ICD-10-CM | POA: Diagnosis not present

## 2017-08-06 DIAGNOSIS — Z833 Family history of diabetes mellitus: Secondary | ICD-10-CM

## 2017-08-06 DIAGNOSIS — F32A Depression, unspecified: Secondary | ICD-10-CM | POA: Diagnosis present

## 2017-08-06 HISTORY — DX: Chronic diastolic (congestive) heart failure: I50.32

## 2017-08-06 HISTORY — DX: Polyneuropathy, unspecified: G62.9

## 2017-08-06 MED ORDER — FENTANYL CITRATE (PF) 100 MCG/2ML IJ SOLN
50.0000 ug | Freq: Once | INTRAMUSCULAR | Status: AC
  Administered 2017-08-07: 50 ug via INTRAVENOUS
  Filled 2017-08-06: qty 2

## 2017-08-06 NOTE — ED Triage Notes (Signed)
Per [facility], Pt from home. Pt slipped and fell in the kitchen this evening. Pt has had multiple falls recently per husband due to neuropathy. Pt is complaining of pain in her R hip and back of head. Pt reports she did not hit her head nor lose consciousness. Pt has shortening and outward rotation noted to R leg. Pt stopped taking blood thinners 2 months ago. Pt alert and oriented x4. Husband reports pt has had some confusion lately, She woke up this morning and didn't know who she was or who her husband was.

## 2017-08-07 ENCOUNTER — Encounter (HOSPITAL_COMMUNITY)
Admission: EM | Disposition: A | Discharge status: Skilled Nursing Facility | Payer: Self-pay | Source: Home / Self Care | Attending: Nephrology

## 2017-08-07 ENCOUNTER — Inpatient Hospital Stay (HOSPITAL_COMMUNITY): Payer: Medicare Other | Admitting: Certified Registered Nurse Anesthetist

## 2017-08-07 ENCOUNTER — Emergency Department (HOSPITAL_COMMUNITY): Payer: Medicare Other

## 2017-08-07 ENCOUNTER — Inpatient Hospital Stay (HOSPITAL_COMMUNITY): Payer: Medicare Other

## 2017-08-07 ENCOUNTER — Encounter (HOSPITAL_COMMUNITY): Payer: Self-pay | Admitting: Internal Medicine

## 2017-08-07 DIAGNOSIS — D62 Acute posthemorrhagic anemia: Secondary | ICD-10-CM | POA: Diagnosis not present

## 2017-08-07 DIAGNOSIS — R401 Stupor: Secondary | ICD-10-CM | POA: Diagnosis not present

## 2017-08-07 DIAGNOSIS — E1165 Type 2 diabetes mellitus with hyperglycemia: Secondary | ICD-10-CM | POA: Diagnosis present

## 2017-08-07 DIAGNOSIS — N183 Chronic kidney disease, stage 3 unspecified: Secondary | ICD-10-CM | POA: Diagnosis present

## 2017-08-07 DIAGNOSIS — S72401A Unspecified fracture of lower end of right femur, initial encounter for closed fracture: Secondary | ICD-10-CM | POA: Diagnosis present

## 2017-08-07 DIAGNOSIS — F32A Depression, unspecified: Secondary | ICD-10-CM | POA: Diagnosis present

## 2017-08-07 DIAGNOSIS — M25551 Pain in right hip: Secondary | ICD-10-CM | POA: Diagnosis present

## 2017-08-07 DIAGNOSIS — E119 Type 2 diabetes mellitus without complications: Secondary | ICD-10-CM

## 2017-08-07 DIAGNOSIS — I5032 Chronic diastolic (congestive) heart failure: Secondary | ICD-10-CM | POA: Diagnosis present

## 2017-08-07 DIAGNOSIS — Z951 Presence of aortocoronary bypass graft: Secondary | ICD-10-CM | POA: Diagnosis not present

## 2017-08-07 DIAGNOSIS — Z7901 Long term (current) use of anticoagulants: Secondary | ICD-10-CM | POA: Diagnosis not present

## 2017-08-07 DIAGNOSIS — R410 Disorientation, unspecified: Secondary | ICD-10-CM | POA: Diagnosis not present

## 2017-08-07 DIAGNOSIS — I2510 Atherosclerotic heart disease of native coronary artery without angina pectoris: Secondary | ICD-10-CM | POA: Diagnosis present

## 2017-08-07 DIAGNOSIS — E876 Hypokalemia: Secondary | ICD-10-CM | POA: Diagnosis not present

## 2017-08-07 DIAGNOSIS — Z23 Encounter for immunization: Secondary | ICD-10-CM | POA: Diagnosis present

## 2017-08-07 DIAGNOSIS — M797 Fibromyalgia: Secondary | ICD-10-CM | POA: Diagnosis present

## 2017-08-07 DIAGNOSIS — S72451A Displaced supracondylar fracture without intracondylar extension of lower end of right femur, initial encounter for closed fracture: Secondary | ICD-10-CM | POA: Diagnosis present

## 2017-08-07 DIAGNOSIS — E1129 Type 2 diabetes mellitus with other diabetic kidney complication: Secondary | ICD-10-CM | POA: Insufficient documentation

## 2017-08-07 DIAGNOSIS — Y92010 Kitchen of single-family (private) house as the place of occurrence of the external cause: Secondary | ICD-10-CM | POA: Diagnosis not present

## 2017-08-07 DIAGNOSIS — E1142 Type 2 diabetes mellitus with diabetic polyneuropathy: Secondary | ICD-10-CM | POA: Diagnosis present

## 2017-08-07 DIAGNOSIS — Z8052 Family history of malignant neoplasm of bladder: Secondary | ICD-10-CM | POA: Diagnosis not present

## 2017-08-07 DIAGNOSIS — Z794 Long term (current) use of insulin: Secondary | ICD-10-CM | POA: Diagnosis not present

## 2017-08-07 DIAGNOSIS — W19XXXA Unspecified fall, initial encounter: Secondary | ICD-10-CM | POA: Diagnosis present

## 2017-08-07 DIAGNOSIS — D72829 Elevated white blood cell count, unspecified: Secondary | ICD-10-CM | POA: Diagnosis present

## 2017-08-07 DIAGNOSIS — W010XXA Fall on same level from slipping, tripping and stumbling without subsequent striking against object, initial encounter: Secondary | ICD-10-CM | POA: Diagnosis present

## 2017-08-07 DIAGNOSIS — E1122 Type 2 diabetes mellitus with diabetic chronic kidney disease: Secondary | ICD-10-CM | POA: Diagnosis present

## 2017-08-07 DIAGNOSIS — Z833 Family history of diabetes mellitus: Secondary | ICD-10-CM | POA: Diagnosis not present

## 2017-08-07 DIAGNOSIS — E785 Hyperlipidemia, unspecified: Secondary | ICD-10-CM | POA: Diagnosis present

## 2017-08-07 DIAGNOSIS — I130 Hypertensive heart and chronic kidney disease with heart failure and stage 1 through stage 4 chronic kidney disease, or unspecified chronic kidney disease: Secondary | ICD-10-CM | POA: Diagnosis present

## 2017-08-07 DIAGNOSIS — Z87891 Personal history of nicotine dependence: Secondary | ICD-10-CM | POA: Diagnosis not present

## 2017-08-07 DIAGNOSIS — G9341 Metabolic encephalopathy: Secondary | ICD-10-CM | POA: Diagnosis not present

## 2017-08-07 DIAGNOSIS — E0590 Thyrotoxicosis, unspecified without thyrotoxic crisis or storm: Secondary | ICD-10-CM | POA: Diagnosis present

## 2017-08-07 DIAGNOSIS — Z79899 Other long term (current) drug therapy: Secondary | ICD-10-CM | POA: Diagnosis not present

## 2017-08-07 DIAGNOSIS — G629 Polyneuropathy, unspecified: Secondary | ICD-10-CM

## 2017-08-07 DIAGNOSIS — F329 Major depressive disorder, single episode, unspecified: Secondary | ICD-10-CM | POA: Diagnosis present

## 2017-08-07 DIAGNOSIS — Z952 Presence of prosthetic heart valve: Secondary | ICD-10-CM | POA: Diagnosis not present

## 2017-08-07 DIAGNOSIS — Z7982 Long term (current) use of aspirin: Secondary | ICD-10-CM | POA: Diagnosis not present

## 2017-08-07 HISTORY — PX: ORIF FEMUR FRACTURE: SHX2119

## 2017-08-07 LAB — BASIC METABOLIC PANEL
Anion gap: 11 (ref 5–15)
BUN: 23 mg/dL — AB (ref 6–20)
CO2: 24 mmol/L (ref 22–32)
Calcium: 9.2 mg/dL (ref 8.9–10.3)
Chloride: 101 mmol/L (ref 101–111)
Creatinine, Ser: 1.53 mg/dL — ABNORMAL HIGH (ref 0.44–1.00)
GFR calc Af Amer: 38 mL/min — ABNORMAL LOW (ref >60)
GFR calc non Af Amer: 33 mL/min — ABNORMAL LOW (ref >60)
GLUCOSE: 359 mg/dL — AB (ref 65–99)
POTASSIUM: 4.2 mmol/L (ref 3.5–5.1)
SODIUM: 136 mmol/L (ref 135–145)

## 2017-08-07 LAB — CBC WITH DIFFERENTIAL/PLATELET
BASOS ABS: 0 10*3/uL (ref 0.0–0.1)
Basophils Relative: 0 %
EOS PCT: 1 %
Eosinophils Absolute: 0.1 10*3/uL (ref 0.0–0.7)
HCT: 33.9 % — ABNORMAL LOW (ref 36.0–46.0)
Hemoglobin: 11.1 g/dL — ABNORMAL LOW (ref 12.0–15.0)
LYMPHS ABS: 1.8 10*3/uL (ref 0.7–4.0)
LYMPHS PCT: 12 %
MCH: 30.7 pg (ref 26.0–34.0)
MCHC: 32.7 g/dL (ref 30.0–36.0)
MCV: 93.9 fL (ref 78.0–100.0)
Monocytes Absolute: 0.7 10*3/uL (ref 0.1–1.0)
Monocytes Relative: 5 %
NEUTROS ABS: 12.3 10*3/uL — AB (ref 1.7–7.7)
Neutrophils Relative %: 82 %
Platelets: 302 10*3/uL (ref 150–400)
RBC: 3.61 MIL/uL — ABNORMAL LOW (ref 3.87–5.11)
RDW: 12.8 % (ref 11.5–15.5)
WBC: 14.9 10*3/uL — AB (ref 4.0–10.5)

## 2017-08-07 LAB — TYPE AND SCREEN
ABO/RH(D): A POS
Antibody Screen: NEGATIVE

## 2017-08-07 LAB — PROTIME-INR
INR: 1
PROTHROMBIN TIME: 13.1 s (ref 11.4–15.2)

## 2017-08-07 LAB — SURGICAL PCR SCREEN
MRSA, PCR: NEGATIVE
Staphylococcus aureus: NEGATIVE

## 2017-08-07 LAB — URINALYSIS, ROUTINE W REFLEX MICROSCOPIC
BILIRUBIN URINE: NEGATIVE
HGB URINE DIPSTICK: NEGATIVE
Ketones, ur: 5 mg/dL — AB
LEUKOCYTES UA: NEGATIVE
NITRITE: NEGATIVE
PH: 6 (ref 5.0–8.0)
Protein, ur: 100 mg/dL — AB
SPECIFIC GRAVITY, URINE: 1.016 (ref 1.005–1.030)

## 2017-08-07 LAB — GLUCOSE, CAPILLARY
GLUCOSE-CAPILLARY: 445 mg/dL — AB (ref 65–99)
GLUCOSE-CAPILLARY: 158 mg/dL — AB (ref 65–99)
GLUCOSE-CAPILLARY: 124 mg/dL — AB (ref 65–99)
GLUCOSE-CAPILLARY: 117 mg/dL — AB (ref 65–99)
GLUCOSE-CAPILLARY: 123 mg/dL — AB (ref 65–99)
Glucose-Capillary: 397 mg/dL — ABNORMAL HIGH (ref 65–99)
Glucose-Capillary: 386 mg/dL — ABNORMAL HIGH (ref 65–99)
Glucose-Capillary: 108 mg/dL — ABNORMAL HIGH (ref 65–99)

## 2017-08-07 LAB — ABO/RH: ABO/RH(D): A POS

## 2017-08-07 LAB — BRAIN NATRIURETIC PEPTIDE: B NATRIURETIC PEPTIDE 5: 524 pg/mL — AB (ref 0.0–100.0)

## 2017-08-07 LAB — APTT: APTT: 30 s (ref 24–36)

## 2017-08-07 LAB — HEMOGLOBIN A1C
HEMOGLOBIN A1C: 8.6 % — AB (ref 4.8–5.6)
MEAN PLASMA GLUCOSE: 200.12 mg/dL

## 2017-08-07 SURGERY — OPEN REDUCTION INTERNAL FIXATION (ORIF) DISTAL FEMUR FRACTURE
Anesthesia: General | Laterality: Right

## 2017-08-07 MED ORDER — VITAMIN B-6 100 MG PO TABS
100.0000 mg | ORAL_TABLET | Freq: Every day | ORAL | Status: DC
  Administered 2017-08-09 – 2017-08-13 (×5): 100 mg via ORAL
  Filled 2017-08-07 (×7): qty 1

## 2017-08-07 MED ORDER — TOBRAMYCIN SULFATE 1.2 G IJ SOLR
INTRAMUSCULAR | Status: DC | PRN
  Administered 2017-08-07: 1.2 g via TOPICAL

## 2017-08-07 MED ORDER — TOBRAMYCIN SULFATE 1.2 G IJ SOLR
INTRAMUSCULAR | Status: AC
  Filled 2017-08-07: qty 1.2

## 2017-08-07 MED ORDER — FENTANYL CITRATE (PF) 100 MCG/2ML IJ SOLN: 25.0000 ug | INTRAMUSCULAR | Status: DC | PRN

## 2017-08-07 MED ORDER — VITAMIN B-12 1000 MCG PO TABS
1000.0000 ug | ORAL_TABLET | Freq: Every day | ORAL | Status: DC
  Administered 2017-08-09 – 2017-08-13 (×5): 1000 ug via ORAL
  Filled 2017-08-07 (×5): qty 1

## 2017-08-07 MED ORDER — ONDANSETRON HCL 4 MG/2ML IJ SOLN
INTRAMUSCULAR | Status: DC | PRN
Start: 2017-08-07 — End: 2017-08-07
  Administered 2017-08-07: 4 mg via INTRAVENOUS

## 2017-08-07 MED ORDER — ACETAMINOPHEN 500 MG PO TABS
500.0000 mg | ORAL_TABLET | Freq: Four times a day (QID) | ORAL | Status: DC | PRN
  Administered 2017-08-10 – 2017-08-11 (×2): 500 mg via ORAL
  Filled 2017-08-07 (×2): qty 1

## 2017-08-07 MED ORDER — BRIMONIDINE TARTRATE 0.15 % OP SOLN
1.0000 [drp] | Freq: Three times a day (TID) | OPHTHALMIC | Status: DC
  Administered 2017-08-08 – 2017-08-13 (×17): 1 [drp] via OPHTHALMIC
  Filled 2017-08-07 (×2): qty 5

## 2017-08-07 MED ORDER — METOPROLOL TARTRATE 50 MG PO TABS
50.0000 mg | ORAL_TABLET | Freq: Two times a day (BID) | ORAL | Status: DC
  Administered 2017-08-07 – 2017-08-13 (×8): 50 mg via ORAL
  Filled 2017-08-07 (×9): qty 1

## 2017-08-07 MED ORDER — LIDOCAINE HCL (CARDIAC) 20 MG/ML IV SOLN
INTRAVENOUS | Status: DC | PRN
  Administered 2017-08-07: 40 mg via INTRAVENOUS

## 2017-08-07 MED ORDER — LATANOPROST 0.005 % OP SOLN
1.0000 [drp] | Freq: Every day | OPHTHALMIC | Status: DC
  Administered 2017-08-08 – 2017-08-12 (×5): 1 [drp] via OPHTHALMIC
  Filled 2017-08-07 (×2): qty 2.5

## 2017-08-07 MED ORDER — ALBUMIN HUMAN 5 % IV SOLN
INTRAVENOUS | Status: DC | PRN
  Administered 2017-08-07: 14:00:00 via INTRAVENOUS

## 2017-08-07 MED ORDER — VANCOMYCIN HCL 1000 MG IV SOLR
INTRAVENOUS | Status: AC
  Filled 2017-08-07: qty 1000

## 2017-08-07 MED ORDER — DICYCLOMINE HCL 20 MG PO TABS
20.0000 mg | ORAL_TABLET | Freq: Two times a day (BID) | ORAL | Status: DC
  Administered 2017-08-07 – 2017-08-13 (×7): 20 mg via ORAL
  Filled 2017-08-07 (×12): qty 1

## 2017-08-07 MED ORDER — VANCOMYCIN HCL 1000 MG IV SOLR
INTRAVENOUS | Status: DC | PRN
  Administered 2017-08-07: 1000 mg via TOPICAL

## 2017-08-07 MED ORDER — OXYCODONE-ACETAMINOPHEN 5-325 MG PO TABS
1.0000 | ORAL_TABLET | ORAL | Status: DC | PRN
  Administered 2017-08-07: 1 via ORAL
  Filled 2017-08-07: qty 1

## 2017-08-07 MED ORDER — HYDRALAZINE HCL 20 MG/ML IJ SOLN
5.0000 mg | INTRAMUSCULAR | Status: DC | PRN
  Administered 2017-08-08 – 2017-08-09 (×3): 5 mg via INTRAVENOUS
  Filled 2017-08-07 (×3): qty 1

## 2017-08-07 MED ORDER — SENNOSIDES-DOCUSATE SODIUM 8.6-50 MG PO TABS: 1.0000 | ORAL_TABLET | Freq: Every evening | ORAL | Status: DC | PRN

## 2017-08-07 MED ORDER — INSULIN GLARGINE 100 UNIT/ML ~~LOC~~ SOLN
15.0000 [IU] | Freq: Two times a day (BID) | SUBCUTANEOUS | Status: DC
  Administered 2017-08-07 – 2017-08-08 (×2): 15 [IU] via SUBCUTANEOUS
  Filled 2017-08-07 (×2): qty 0.15

## 2017-08-07 MED ORDER — FUROSEMIDE 20 MG PO TABS: 60.0000 mg | ORAL_TABLET | Freq: Every day | ORAL | Status: DC

## 2017-08-07 MED ORDER — LACTATED RINGERS IV SOLN
INTRAVENOUS | Status: DC | PRN
  Administered 2017-08-07: 13:00:00 via INTRAVENOUS

## 2017-08-07 MED ORDER — FUROSEMIDE 40 MG PO TABS: 60.0000 mg | ORAL_TABLET | Freq: Every day | ORAL | Status: DC

## 2017-08-07 MED ORDER — FLUOROMETHOLONE 0.1 % OP SUSP
1.0000 [drp] | Freq: Four times a day (QID) | OPHTHALMIC | Status: DC
  Administered 2017-08-08 – 2017-08-13 (×20): 1 [drp] via OPHTHALMIC
  Filled 2017-08-07 (×2): qty 5

## 2017-08-07 MED ORDER — HYDROXYZINE HCL 10 MG PO TABS: 10.0000 mg | ORAL_TABLET | Freq: Three times a day (TID) | ORAL | Status: DC | PRN

## 2017-08-07 MED ORDER — 0.9 % SODIUM CHLORIDE (POUR BTL) OPTIME
TOPICAL | Status: DC | PRN
  Administered 2017-08-07: 1000 mL

## 2017-08-07 MED ORDER — VALACYCLOVIR HCL 500 MG PO TABS
500.0000 mg | ORAL_TABLET | Freq: Every day | ORAL | Status: DC
Start: 2017-08-07 — End: 2017-08-13
  Administered 2017-08-09 – 2017-08-13 (×5): 500 mg via ORAL
  Filled 2017-08-07 (×6): qty 1

## 2017-08-07 MED ORDER — MORPHINE SULFATE (PF) 4 MG/ML IV SOLN
2.0000 mg | INTRAVENOUS | Status: DC | PRN
  Administered 2017-08-07 (×2): 2 mg via INTRAVENOUS
  Administered 2017-08-09: 1 mg via INTRAVENOUS
  Filled 2017-08-07 (×3): qty 1

## 2017-08-07 MED ORDER — FLUOXETINE HCL 20 MG PO CAPS
40.0000 mg | ORAL_CAPSULE | Freq: Every day | ORAL | Status: DC
Start: 2017-08-07 — End: 2017-08-13
  Administered 2017-08-09 – 2017-08-13 (×4): 40 mg via ORAL
  Filled 2017-08-07 (×5): qty 2

## 2017-08-07 MED ORDER — INSULIN GLARGINE 100 UNIT/ML ~~LOC~~ SOLN
10.0000 [IU] | Freq: Two times a day (BID) | SUBCUTANEOUS | Status: DC
  Administered 2017-08-07: 10 [IU] via SUBCUTANEOUS
  Filled 2017-08-07: qty 0.1

## 2017-08-07 MED ORDER — FENTANYL CITRATE (PF) 100 MCG/2ML IJ SOLN
INTRAMUSCULAR | Status: DC | PRN
  Administered 2017-08-07: 50 ug via INTRAVENOUS
  Administered 2017-08-07 (×3): 25 ug via INTRAVENOUS

## 2017-08-07 MED ORDER — ONDANSETRON HCL 4 MG/2ML IJ SOLN: 4.0000 mg | Freq: Three times a day (TID) | INTRAMUSCULAR | Status: DC | PRN

## 2017-08-07 MED ORDER — TIMOLOL MALEATE 0.5 % OP SOLN
1.0000 [drp] | Freq: Two times a day (BID) | OPHTHALMIC | Status: DC
  Administered 2017-08-08 – 2017-08-13 (×10): 1 [drp] via OPHTHALMIC
  Filled 2017-08-07 (×2): qty 5

## 2017-08-07 MED ORDER — PRAVASTATIN SODIUM 40 MG PO TABS
80.0000 mg | ORAL_TABLET | Freq: Every day | ORAL | Status: DC
  Administered 2017-08-09 – 2017-08-12 (×3): 80 mg via ORAL
  Filled 2017-08-07 (×3): qty 2

## 2017-08-07 MED ORDER — ZOLPIDEM TARTRATE 5 MG PO TABS: 5.0000 mg | ORAL_TABLET | Freq: Every evening | ORAL | Status: DC | PRN

## 2017-08-07 MED ORDER — SODIUM CHLORIDE 0.9 % IV SOLN
INTRAVENOUS | Status: AC
  Administered 2017-08-07: 5.1 [IU]/h via INTRAVENOUS
  Filled 2017-08-07: qty 1

## 2017-08-07 MED ORDER — NITROGLYCERIN 0.4 MG SL SUBL: 0.4000 mg | SUBLINGUAL_TABLET | SUBLINGUAL | Status: DC | PRN

## 2017-08-07 MED ORDER — CEFAZOLIN SODIUM-DEXTROSE 2-4 GM/100ML-% IV SOLN
2.0000 g | Freq: Once | INTRAVENOUS | Status: AC
  Administered 2017-08-07: 2 g via INTRAVENOUS

## 2017-08-07 MED ORDER — LIFITEGRAST 5 % OP SOLN: 1.0000 [drp] | Freq: Two times a day (BID) | OPHTHALMIC | Status: DC

## 2017-08-07 MED ORDER — LOTEPREDNOL ETABONATE 0.5 % OP GEL: 1.0000 [drp] | Freq: Every day | OPHTHALMIC | Status: DC

## 2017-08-07 MED ORDER — ROCURONIUM BROMIDE 100 MG/10ML IV SOLN
INTRAVENOUS | Status: DC | PRN
  Administered 2017-08-07: 20 mg via INTRAVENOUS
  Administered 2017-08-07: 50 mg via INTRAVENOUS

## 2017-08-07 MED ORDER — METHOCARBAMOL 500 MG PO TABS
500.0000 mg | ORAL_TABLET | Freq: Three times a day (TID) | ORAL | Status: DC | PRN
  Administered 2017-08-07: 500 mg via ORAL
  Filled 2017-08-07: qty 1

## 2017-08-07 MED ORDER — INSULIN ASPART 100 UNIT/ML ~~LOC~~ SOLN
10.0000 [IU] | Freq: Once | SUBCUTANEOUS | Status: AC
  Administered 2017-08-07: 10 [IU] via SUBCUTANEOUS

## 2017-08-07 MED ORDER — MUPIROCIN 2 % EX OINT
1.0000 "application " | TOPICAL_OINTMENT | Freq: Two times a day (BID) | CUTANEOUS | Status: DC
  Administered 2017-08-07 – 2017-08-08 (×3): 1 via NASAL
  Filled 2017-08-07: qty 22

## 2017-08-07 MED ORDER — SUGAMMADEX SODIUM 200 MG/2ML IV SOLN
INTRAVENOUS | Status: DC | PRN
  Administered 2017-08-07: 160 mg via INTRAVENOUS

## 2017-08-07 MED ORDER — PROPRANOLOL HCL 40 MG PO TABS
40.0000 mg | ORAL_TABLET | Freq: Two times a day (BID) | ORAL | Status: DC
  Administered 2017-08-07 (×2): 40 mg via ORAL
  Filled 2017-08-07 (×2): qty 1
  Filled 2017-08-07: qty 2
  Filled 2017-08-07: qty 1

## 2017-08-07 MED ORDER — BACLOFEN 10 MG PO TABS
10.0000 mg | ORAL_TABLET | Freq: Two times a day (BID) | ORAL | Status: DC
  Administered 2017-08-07 – 2017-08-11 (×5): 10 mg via ORAL
  Filled 2017-08-07 (×10): qty 1

## 2017-08-07 MED ORDER — GLUCERNA SHAKE PO LIQD
237.0000 mL | Freq: Three times a day (TID) | ORAL | Status: DC
  Administered 2017-08-09 – 2017-08-13 (×5): 237 mL via ORAL

## 2017-08-07 MED ORDER — TRIAMCINOLONE ACETONIDE 0.1 % EX OINT
TOPICAL_OINTMENT | Freq: Two times a day (BID) | CUTANEOUS | Status: DC
Start: 2017-08-07 — End: 2017-08-13
  Administered 2017-08-08 – 2017-08-10 (×5): via TOPICAL
  Filled 2017-08-07 (×2): qty 15

## 2017-08-07 MED ORDER — DOXEPIN HCL 10 MG PO CAPS
10.0000 mg | ORAL_CAPSULE | Freq: Every day | ORAL | Status: DC
  Administered 2017-08-07 – 2017-08-12 (×3): 10 mg via ORAL
  Filled 2017-08-07 (×6): qty 1

## 2017-08-07 MED ORDER — INFLUENZA VAC SPLIT HIGH-DOSE 0.5 ML IM SUSY
0.5000 mL | PREFILLED_SYRINGE | INTRAMUSCULAR | Status: AC
  Administered 2017-08-12: 0.5 mL via INTRAMUSCULAR
  Filled 2017-08-07 (×2): qty 0.5

## 2017-08-07 MED ORDER — FLUOXETINE HCL 20 MG PO CAPS
20.0000 mg | ORAL_CAPSULE | Freq: Every day | ORAL | Status: DC
  Administered 2017-08-07 – 2017-08-12 (×3): 20 mg via ORAL
  Filled 2017-08-07 (×3): qty 1

## 2017-08-07 MED ORDER — FENTANYL CITRATE (PF) 250 MCG/5ML IJ SOLN
INTRAMUSCULAR | Status: AC
  Filled 2017-08-07: qty 5

## 2017-08-07 MED ORDER — INSULIN ASPART 100 UNIT/ML ~~LOC~~ SOLN
0.0000 [IU] | Freq: Three times a day (TID) | SUBCUTANEOUS | Status: DC
  Administered 2017-08-07: 9 [IU] via SUBCUTANEOUS
  Administered 2017-08-08 (×3): 2 [IU] via SUBCUTANEOUS
  Administered 2017-08-09: 5 [IU] via SUBCUTANEOUS
  Administered 2017-08-09 (×2): 2 [IU] via SUBCUTANEOUS
  Administered 2017-08-10: 3 [IU] via SUBCUTANEOUS
  Administered 2017-08-10: 7 [IU] via SUBCUTANEOUS
  Administered 2017-08-10: 3 [IU] via SUBCUTANEOUS
  Administered 2017-08-11: 2 [IU] via SUBCUTANEOUS
  Administered 2017-08-11: 5 [IU] via SUBCUTANEOUS
  Administered 2017-08-12: 3 [IU] via SUBCUTANEOUS
  Administered 2017-08-12: 2 [IU] via SUBCUTANEOUS
  Administered 2017-08-12: 5 [IU] via SUBCUTANEOUS
  Administered 2017-08-13: 2 [IU] via SUBCUTANEOUS
  Administered 2017-08-13: 7 [IU] via SUBCUTANEOUS

## 2017-08-07 MED ORDER — FENTANYL CITRATE (PF) 100 MCG/2ML IJ SOLN
50.0000 ug | Freq: Once | INTRAMUSCULAR | Status: AC
  Administered 2017-08-07: 50 ug via INTRAVENOUS
  Filled 2017-08-07: qty 2

## 2017-08-07 MED ORDER — PROPOFOL 10 MG/ML IV BOLUS
INTRAVENOUS | Status: DC | PRN
  Administered 2017-08-07: 50 mg via INTRAVENOUS

## 2017-08-07 MED ORDER — METHIMAZOLE 5 MG PO TABS
5.0000 mg | ORAL_TABLET | Freq: Every morning | ORAL | Status: DC
  Administered 2017-08-09 – 2017-08-13 (×5): 5 mg via ORAL
  Filled 2017-08-07 (×7): qty 1

## 2017-08-07 MED ORDER — CEFAZOLIN SODIUM-DEXTROSE 2-4 GM/100ML-% IV SOLN
2.0000 g | Freq: Three times a day (TID) | INTRAVENOUS | Status: AC
  Administered 2017-08-07 – 2017-08-08 (×3): 2 g via INTRAVENOUS
  Filled 2017-08-07 (×3): qty 100

## 2017-08-07 MED ORDER — GABAPENTIN 300 MG PO CAPS
600.0000 mg | ORAL_CAPSULE | Freq: Three times a day (TID) | ORAL | Status: DC
  Administered 2017-08-07: 600 mg via ORAL
  Filled 2017-08-07: qty 2

## 2017-08-07 MED ORDER — LACTATED RINGERS IV SOLN
INTRAVENOUS | Status: DC | PRN
  Administered 2017-08-07 (×2): via INTRAVENOUS

## 2017-08-07 MED ORDER — ISOSORBIDE MONONITRATE ER 60 MG PO TB24: 60.0000 mg | ORAL_TABLET | Freq: Every day | ORAL | Status: DC

## 2017-08-07 MED ORDER — CEFAZOLIN SODIUM-DEXTROSE 2-4 GM/100ML-% IV SOLN
INTRAVENOUS | Status: AC
  Filled 2017-08-07: qty 100

## 2017-08-07 SURGICAL SUPPLY — 73 items
BANDAGE ACE 4X5 VEL STRL LF (GAUZE/BANDAGES/DRESSINGS) IMPLANT
BANDAGE ACE 6X5 VEL STRL LF (GAUZE/BANDAGES/DRESSINGS) ×2 IMPLANT
BIT DRILL 4.3 (BIT) ×2
BIT DRILL 4.3X300MM (BIT) ×1 IMPLANT
BIT DRILL QC 3.3X195 (BIT) ×2 IMPLANT
BLADE CLIPPER SURG (BLADE) IMPLANT
BNDG GAUZE ELAST 4 BULKY (GAUZE/BANDAGES/DRESSINGS) IMPLANT
BRUSH SCRUB SURG 4.25 DISP (MISCELLANEOUS) ×2 IMPLANT
CANISTER SUCT 3000ML PPV (MISCELLANEOUS) ×2 IMPLANT
CAP LOCK NCB (Cap) ×18 IMPLANT
CHLORAPREP W/TINT 26ML (MISCELLANEOUS) ×2 IMPLANT
COVER SURGICAL LIGHT HANDLE (MISCELLANEOUS) ×2 IMPLANT
DERMABOND ADVANCED (GAUZE/BANDAGES/DRESSINGS) ×1
DERMABOND ADVANCED .7 DNX12 (GAUZE/BANDAGES/DRESSINGS) ×1 IMPLANT
DRAPE C-ARM 42X72 X-RAY (DRAPES) ×2 IMPLANT
DRAPE C-ARMOR (DRAPES) ×2 IMPLANT
DRAPE EXTREMITY T 121X128X90 (DRAPE) IMPLANT
DRAPE IMP U-DRAPE 54X76 (DRAPES) ×2 IMPLANT
DRAPE U-SHAPE 47X51 STRL (DRAPES) ×2 IMPLANT
DRSG ADAPTIC 3X8 NADH LF (GAUZE/BANDAGES/DRESSINGS) ×2 IMPLANT
DRSG MEPILEX BORDER 4X8 (GAUZE/BANDAGES/DRESSINGS) ×2 IMPLANT
DRSG PAD ABDOMINAL 8X10 ST (GAUZE/BANDAGES/DRESSINGS) IMPLANT
ELECT REM PT RETURN 9FT ADLT (ELECTROSURGICAL) ×2
ELECTRODE REM PT RTRN 9FT ADLT (ELECTROSURGICAL) ×1 IMPLANT
EVACUATOR 1/8 PVC DRAIN (DRAIN) IMPLANT
EVACUATOR 3/16  PVC DRAIN (DRAIN)
EVACUATOR 3/16 PVC DRAIN (DRAIN) IMPLANT
GAUZE SPONGE 4X4 12PLY STRL (GAUZE/BANDAGES/DRESSINGS) IMPLANT
GLOVE BIO SURGEON STRL SZ7.5 (GLOVE) ×8 IMPLANT
GLOVE BIOGEL PI IND STRL 7.5 (GLOVE) ×1 IMPLANT
GLOVE BIOGEL PI INDICATOR 7.5 (GLOVE) ×1
GOWN STRL REUS W/ TWL LRG LVL3 (GOWN DISPOSABLE) ×4 IMPLANT
GOWN STRL REUS W/TWL LRG LVL3 (GOWN DISPOSABLE) ×4
K-WIRE 2.0 (WIRE) ×3
K-WIRE FXSTD 280X2XNS SS (WIRE) ×3
KIT BASIN OR (CUSTOM PROCEDURE TRAY) ×2 IMPLANT
KIT ROOM TURNOVER OR (KITS) ×2 IMPLANT
KWIRE FXSTD 280X2XNS SS (WIRE) ×3 IMPLANT
NEEDLE 22X1 1/2 (OR ONLY) (NEEDLE) IMPLANT
NS IRRIG 1000ML POUR BTL (IV SOLUTION) ×2 IMPLANT
PACK TOTAL JOINT (CUSTOM PROCEDURE TRAY) ×2 IMPLANT
PACK UNIVERSAL I (CUSTOM PROCEDURE TRAY) ×2 IMPLANT
PAD ARMBOARD 7.5X6 YLW CONV (MISCELLANEOUS) ×4 IMPLANT
PAD CAST 4YDX4 CTTN HI CHSV (CAST SUPPLIES) ×1 IMPLANT
PADDING CAST COTTON 4X4 STRL (CAST SUPPLIES) ×1
PADDING CAST COTTON 6X4 STRL (CAST SUPPLIES) ×2 IMPLANT
PLATE FEM DIST NCB PP 278MM (Plate) ×2 IMPLANT
SCREW 5.0 70MM (Screw) ×2 IMPLANT
SCREW 5.0 80MM (Screw) ×2 IMPLANT
SCREW NCB 3.5X75X5X6.2XST (Screw) ×2 IMPLANT
SCREW NCB 5.0X34MM (Screw) ×2 IMPLANT
SCREW NCB 5.0X36MM (Screw) ×8 IMPLANT
SCREW NCB 5.0X75MM (Screw) ×2 IMPLANT
SPONGE LAP 18X18 X RAY DECT (DISPOSABLE) ×2 IMPLANT
STAPLER VISISTAT 35W (STAPLE) ×2 IMPLANT
SUCTION FRAZIER HANDLE 10FR (MISCELLANEOUS) ×1
SUCTION TUBE FRAZIER 10FR DISP (MISCELLANEOUS) ×1 IMPLANT
SUT MNCRL AB 3-0 PS2 18 (SUTURE) ×2 IMPLANT
SUT MON AB 2-0 CT1 36 (SUTURE) ×2 IMPLANT
SUT PROLENE 0 CT (SUTURE) IMPLANT
SUT PROLENE 0 CT 2 (SUTURE) IMPLANT
SUT VIC AB 0 CT1 27 (SUTURE) ×2
SUT VIC AB 0 CT1 27XBRD ANBCTR (SUTURE) ×2 IMPLANT
SUT VIC AB 1 CT1 27 (SUTURE) ×2
SUT VIC AB 1 CT1 27XBRD ANBCTR (SUTURE) ×2 IMPLANT
SUT VIC AB 2-0 CT1 27 (SUTURE) ×2
SUT VIC AB 2-0 CT1 TAPERPNT 27 (SUTURE) ×2 IMPLANT
SYR 20ML ECCENTRIC (SYRINGE) IMPLANT
TOWEL OR 17X24 6PK STRL BLUE (TOWEL DISPOSABLE) ×6 IMPLANT
TOWEL OR 17X26 10 PK STRL BLUE (TOWEL DISPOSABLE) ×4 IMPLANT
TRAY FOLEY W/METER SILVER 16FR (SET/KITS/TRAYS/PACK) IMPLANT
WATER STERILE IRR 1000ML POUR (IV SOLUTION) IMPLANT
YANKAUER SUCT BULB TIP NO VENT (SUCTIONS) ×2 IMPLANT

## 2017-08-07 NOTE — Progress Notes (Signed)
Attempt to call report.

## 2017-08-07 NOTE — Anesthesia Preprocedure Evaluation (Addendum)
Anesthesia Evaluation  Patient identified by MRN, date of birth, ID band Patient confused  General Assessment Comment:Pt sedated from pain meds  Reviewed: Allergy & Precautions, NPO status , Patient's Chart, lab work & pertinent test results, reviewed documented beta blocker date and time   History of Anesthesia Complications Negative for: history of anesthetic complications  Airway Mallampati: II  TM Distance: >3 FB Neck ROM: Full    Dental  (+) Dental Advisory Given, Loose   Pulmonary sleep apnea and Continuous Positive Airway Pressure Ventilation , former smoker,    breath sounds clear to auscultation       Cardiovascular hypertension, Pt. on medications and Pt. on home beta blockers (-) angina+ CAD and + CABG  + pacemaker + Valvular Problems/Murmurs (s/p AVR)  Rhythm:Regular Rate:Normal  3/18 peak gradient is 31 mmHg with a mean gradient of 16. She has normal LV function 4/18 Stress: There is normal physiologic distribution of activity throughout the left ventricular myocardium. No evidence of ischemia. No regional wall motion abnormality is demonstrated. Left ventricular ejection fraction is calculated to be 70%   Neuro/Psych PSYCHIATRIC DISORDERS (pt very confused at times) Depression Chronic back pain    GI/Hepatic Neg liver ROS, GERD  Medicated and Controlled,  Endo/Other  diabetes (glu 386), Insulin DependentMorbid obesity  Renal/GU ESRFRenal disease     Musculoskeletal  (+) Fibromyalgia -  Abdominal (+) + obese,   Peds  Hematology   Anesthesia Other Findings   Reproductive/Obstetrics                           Anesthesia Physical Anesthesia Plan  ASA: III  Anesthesia Plan: General   Post-op Pain Management:    Induction: Intravenous  PONV Risk Score and Plan: 3 and Ondansetron and Treatment [place]in vary due to age or medical condition  Airway Management Planned: Oral  ETT  Additional Equipment:   Intra-op Plan:   Post-operative Plan: Extubation in OR  Informed Consent: I have reviewed the patients History and Physical, chart, labs and discussed the procedure including the risks, benefits and alternatives for the proposed anesthesia with the patient or authorized representative who has indicated his/her understanding and acceptance.   Dental advisory given and Consent reviewed with POA  Plan Discussed with: CRNA and Surgeon  Anesthesia Plan Comments: (Plan routine monitors, GETA Discussed with husband and daughter, who give consent)        Anesthesia Quick Evaluation

## 2017-08-07 NOTE — Progress Notes (Addendum)
Inpatient Diabetes Program Recommendations  AACE/ADA: New Consensus Statement on Inpatient Glycemic Control (2015)  Target Ranges:  Prepandial:   less than 140 mg/dL      Peak postprandial:   less than 180 mg/dL (1-2 hours)      Critically ill patients:  140 - 180 mg/dL   Lab Results  Component Value Date   GLUCAP 445 (H) 08/07/2017    Review of Glycemic ControlResults for Sonya Campbell, CARTON (MRN [id number]) as of 08/07/2017 09:25  Ref. Range 08/07/2017 09:13  Glucose-Capillary Latest Ref Range: 65 - 99 mg/dL 409 (H)   Diabetes history: Type 2 DM-Last A1C=9.8% on 05/19/17 Outpatient Diabetes medications: Lantus 15 units bid, Humulin R 2-6 units tid with meals Current orders for Inpatient glycemic control:  Novolog sensitive tid with meals, Lantus 10 units bid  Inpatient Diabetes Program Recommendations:    Please consider increasing Novolog correction to q 4 hours while NPO.  If patient goes to surgery, [place] consider IV insulin.    Thanks, Beryl Meager, RN, BC-ADM Inpatient Diabetes Coordinator Pager [phone] (8a-5p)

## 2017-08-07 NOTE — Consult Note (Signed)
[facility] (OTS) Consult   Patient ID: Sonya Campbell MRN: [id number] DOB/AGE: [birth year] 73 y.o.   Reason for Consult:right femur fracture Referring Physician: Dr. Zadie Rhine, MD  HPI: Sonya Campbell is an 71 y.o. female who is being seen in consultation at the request of Dr. Bebe Shaggy for evaluation of a right femur fracture. Patient was getting up this evening when she fell on some water that was leaking from the refrigerator. She had immediate pain and deformity and inability to bear weight. She was brought to the ED where x-rays were obtained that showed a right femur fracture. I was consulted for evaluation and care.  The patient is in a lot of pain when I examined her this AM. Most history obtained from husband. She ambulates with a walker at baseline. They have about 5 steps to get into their house. She has a history of knee arthritis in the right knee that they discussed surgery but hasn't had anything done. 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  Past Medical History:  Diagnosis Date  . CAD (coronary artery disease)   . Chronic kidney disease    stage 3  . Depression   . Diabetes mellitus without complication (HCC)   . Fibromyalgia   . Hyperlipidemia   . Hyperthyroidism   . 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 Neuropathy     Past Surgical History:  Procedure Laterality Date  . 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 CORONARY ARTERY BYPASS GRAFT    . KNEE SURGERY     Left knee    No family history on file.  Social History:  reports that she has quit smoking. She has quit using smokeless tobacco. She reports that she does not drink alcohol or use drugs.  Allergies:  Allergies  Allergen Reactions  . Shrimp [Shellfish Allergy] Shortness Of Breath and Swelling    stated to her  by allergist in past.   . Beef-Derived Products Other (See Comments)    Unknown, maybe swelling-patient cant recall at this time, stated to her by allergist in past.  . Benadryl [Diphenhydramine] Other (See Comments)    Causes her to fall asleep, also not able to wake her.    Corky Sox [Cefaclor]     Unknown   . Cymbalta [Duloxetine Hcl]   . Levaquin [Levofloxacin] Itching  . Lipitor [Atorvastatin] Itching  . Metformin And Related Nausea And Vomiting  . Other Itching    Unknown med at this time.   . Pork-Derived Products Other (See Comments)    Unknown, stated to her by allergist from past.     Medications: I have reviewed the patient's current medications.  ROS: 10 system review of systems is negative other than noted in HPI above  Pulse 61, temperature 98.2 F (36.8 C), temperature source Oral, resp. rate (!) 8, SpO2 99 %. Exam: Gen: Awake and alert, in significant pain but able to answer questions RLE: Skin without lesions, Notable swelling and mild deformity about knee in distal femur. No trauma in ankle. Intact EHL/FHL. Sensation intact to light touch to superficial peroneal, deep peroneal, saphenous, sural, and tibial. Warm and well perfused foot.  LLE/RUE/LUE: Skin without lesions, no tenderness to palpation, full painless ROM, full strength in each muscle groups with no evidence of instability  Imaging: X-rays of right femur show a right supracondylar femur fracture. There does not appear to be any intra-articular extension. The patient appears to have significant knee arthritis  Assessment/Plan: 71 year old female with right supracondylar femur fracture  -Discussed care with husband and patient will need to proceed with ORIF this AM -Risks discussed included bleeding requiring blood transfusion, bleeding causing a hematoma, infection, malunion, nonunion, damage to surrounding nerves and blood vessels, pain, hardware prominence or irritation, hardware failure, pain,  stiffness, post-traumatic arthritis, DVT/PE, stroke, heart attack and even death. -will ne nonweightbearing for likely 2-3 months postoperatively.    Roby Lofts, MD [facility] [phone] (phone)

## 2017-08-07 NOTE — Op Note (Signed)
OrthopaedicSurgeryOperativeNote (BMW:[id number]) Date of Surgery: 08/07/2017  Admit Date: 08/06/2017   Diagnoses: Pre-Op Diagnoses: Right supracondylar distal femur without intracondylar extension   Post-Op Diagnosis: Same  Procedures: CPT 27511  Surgeons: Primary: Roby Lofts, MD Assisting: Bjorn Pippin, MD   Location:[facility] OR ROOM 03   AnesthesiaGeneral   Antibiotics:Ancef 2g preop  Tourniquettime:none.  EstimatedBloodLoss:269mL   Complications:None  Specimens:None  Implants:  Implant Name Type Inv. Item Serial No. Manufacturer Lot No. LRB No. Used Action  PLATE FEM DIST NCB PP - [id number] Plate PLATE FEM DIST NCB PP  ZIMMER [employer]OLIN[employer]  Right 1 Implanted  CAP LOCK NCB - [id number] Cap CAP LOCK NCB  [employer]MER [employer]  Right 9 Implanted  SCREW NCB 5.0X34MM - [id number] Screw SCREW NCB 5.0X34MM  [employer]IMMER [employer]IN[employer]  Right 1 Implanted  SCREW NCB 5.0X36MM - [id number] Screw SCREW NCB 5.0X36MM  ZIMMER [employer]OLIN[employer]  Right 4 Implanted  SCREW 5.0 - [id number] Screw SCREW 5.0  ZIMMER [employer]OLIN[employer]  Right 1 Implanted  SCREW NCB 5.0X75MM - [id number] Screw SCREW NCB 5.0X75MM  ZIMMER [employer]OLINAS  Right 2 Implanted  SCREW 5.0 - [id number] Screw SCREW 5.0   ZIMMER CAROLINAS   Right 1 Implanted    IndicationsforSurgery: 71 year old female who was getting up this evening when she fell on some water that was leaking from the refrigerator. She had immediate pain and deformity and inability to bear weight. She was brought to the ED where x-rays were obtained that showed a right femur fracture. I was consulted for evaluation and care. I felt that with her pre-existing arthritis and poor bone that ORIF with lateral plating would be most appropriate. Risks discussed included bleeding requiring blood transfusion, bleeding causing a hematoma, infection, malunion, nonunion, damage to surrounding nerves and blood vessels, pain, hardware prominence or irritation, hardware  failure, pain, stiffness, post-traumatic arthritis, DVT/PE, stroke, heart attack and even death.  Operative Findings: Comminuted extra-articular supracondylar distal femur fracture treated with ORIF with Zimmer NCB 12-hole bridge plate  Procedure: The patient was identified in the preoperative holding area. Consent was confirmed with the patient and their family and all questions were answered. The operative extremity was marked after confirmation with the patient. The patient was then brought back to the operating room by our anesthesia colleagues. The patient was placed under general anesthesia and was carefully transferred over to a radiolucent flattop table. They were secured to the bed and all bony prominences were padded.  A bump was placed under the operative hip. The operative extremity was then prepped and draped in usual sterile fashion. A timeout was performed to verify the patient, the procedure and extremity. Preoperative antibiotics were dosed.  Fluoroscopy was used to identify the fracture. A lateral approach was made to the distal femur. It was taken down through the skin and the IT band was incised in line with the incision. The distal portion of the vastus lateralis was reflected off the IT band and the distal articular block of the lateral femoral condyle was exposed. Subperiosteal dissection was carried to the edges of the articular surface of the knee joint. The fracture was palpate but the soft tissue sleeve, I attempted to keep intact to prevent devascularization. A reduction maneuver was performed to align the fracture in the AP and lateral planes. A percutaneous incision was made anteriorly to use a ball-spiked pusher to reduce the apex deformity of the fracture on the lateral view. Once the fracture was reduced, I provisionally held  it in place with two 2.44mm K-wires in crossing fashion from the medial and lateral condyles.  I used fluoroscopy to identify the correct length  plate to bridge the whole femoral shaft and proximal to the tip of the hip prothesis. I chose a 12-hole plate. The aiming arm was attached to the plate and slide submuscularly under the vastus to the proximal femur. A K-wire was used to appropriately align the distal portion of the plate. A drill sleeve was used to provisionally fix the proximal portion of the plate with a unicortical K-wire. A perfect lateral was obtained to show appropriate positioning of the plate.  The distal segment was drilled and 5.2mm cortical screws were placed in the distal fracture segment. A total of 3 screws were placed. Bicortical 5.58mm cortical screws were placed in the femoral shaft to walk the plate down to bone. These were all placed through the targeting guide with percutaneous incisions. Once all of the screws were in place in the femoral shaft, locking caps were placed over to the screws in the shaft except the most proximal shaft screw. The targeting guide was removed. Two more screws were placed in the distal segment. Fluoroscopy was used to confirm adequate reduction of the fracture and appropriate position of the plate and screws.  The incisions were irrigated with normal saline. A gram of vancomycin powder and 1.2 grams of tobramycin powder was placed in the incision around the plate. The IT band was closed with 0-vicryl. The skin was closed with 2-vicryl, 3-0 monocryl and steristrips. The incisions were dressed with Mepilex dressings and the leg was wrapped with ACE wraps. The patient was then transferred to the regular floor bed and taken to the PACU in stable condition.  Post Op Plan/Instructions: The patient will be nonweightbearing to the right leg. No ROM restrictions. She will receive lovenox for VTE prophylaxis and ancef postoperatively. She will need a stepdown bed for glucose control postoperatively.  I was present and performed the entire surgery.  Truitt Merle, MD [facility]

## 2017-08-07 NOTE — Progress Notes (Signed)
Placed on tele to monitor  

## 2017-08-07 NOTE — Progress Notes (Signed)
Orthopedic Tech Progress Note Patient Details:  Sonya Campbell May 06, 1946 [id number]  Musculoskeletal Traction Type of Traction: Bucks Skin Traction Traction Location: rle Traction Weight: 10 lbs    [name] 08/07/2017, 2:17 AM

## 2017-08-07 NOTE — Progress Notes (Signed)
Transferred in from PACU by bed responsive to painful stimuli.

## 2017-08-07 NOTE — ED Notes (Signed)
Attempted to call report x 1  

## 2017-08-07 NOTE — H&P (Signed)
History and Physical    Sonya Campbell ZOX:[id number] DOB: 07/17/1946 DOA: 08/06/2017  Referring MD/NP/PA:   PCP: Donita Brooks, MD   Patient coming from:  The patient is coming from home.  At baseline, pt is independent for most of ADL.  Chief Complaint: fall and pain in right thigh and knee  HPI: Sonya Campbell is a 71 y.o. female with medical history significant of hypertension, hyperlipidemia, depression, dementia, dCHF, s/p of AVR (bovine valve), fibromyalgia, CKD-III, CAD, CABG, hyperthyroidism, who presents with fall, pain in right thigh and knee.  Patient states that she slipped her steps, fell in the kitchen in the evening, causing injury to right thigh and knee, developed severe right thigh and knee. No LOC. The pain is constant, 10 out of 10 in severity, sharp, nonradiating. Patient states that she has chronic peripheral neuropathy in both legs, no leg weakness. Denies head and neck injury. She does not have chest pain, SOB, cough, fever or chills. No diarrhea, nausea, vomiting, abdominal pain or symptoms of UTI.  ED Course: pt was found to have WBC 14.9, stable renal function, INR 1.0, temperature normal, no tachycardia, oxygen saturation 100% on room air, chest x-ray negative. CT of the head is negative. CT of C-spine is negative for bony fracture, but showed degenerative disc disease. X-ray of her right hip showed distal femur fracture. Patient is admitted to telemetry bed as inpatient. Orthopedic surgeon, Dr. Jena Gauss was consulted.  Review of Systems:   General: no fevers, chills, no body weight gain, has fatigue HEENT: no blurry vision, hearing changes or sore throat Respiratory: no dyspnea, coughing, wheezing CV: no chest pain, no palpitations GI: no nausea, vomiting, abdominal pain, diarrhea, constipation GU: no dysuria, burning on urination, increased urinary frequency, hematuria  Ext: has trace leg edema Neuro: no unilateral weakness, numbness, or tingling, no vision  change or hearing loss. Had fall. Skin: no rash, no skin tear. MSK: has pain in right thigh and knee Heme: No easy bruising.  Travel history: No recent long distant travel.  Allergy:  Allergies  Allergen Reactions  . Shrimp [Shellfish Allergy] Shortness Of Breath and Swelling    stated to her by allergist in past.   . Beef-Derived Products Other (See Comments)    Unknown, maybe swelling-patient cant recall at this time, stated to her by allergist in past.  . Benadryl [Diphenhydramine] Other (See Comments)    Causes her to fall asleep, also not able to wake her.    Corky Sox [Cefaclor]     Unknown   . Cymbalta [Duloxetine Hcl]   . Levaquin [Levofloxacin] Itching  . Lipitor [Atorvastatin] Itching  . Metformin And Related Nausea And Vomiting  . Other Itching    Unknown med at this time.   . Pork-Derived Products Other (See Comments)    Unknown, stated to her by allergist from past.     Past Medical History:  Diagnosis Date  . CAD (coronary artery disease)   . Chronic diastolic (congestive) heart failure (HCC)   . Chronic kidney disease    stage 3  . Depression   . Diabetes mellitus without complication (HCC)   . Fibromyalgia   . Hyperlipidemia   . Hyperthyroidism   . 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 Neuropathy     Past Surgical History:  Procedure Laterality Date  . 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  CORONARY ARTERY BYPASS GRAFT    . KNEE SURGERY     Left knee    Social History:  reports that she has quit smoking. She has quit using smokeless tobacco. She reports that she does not drink alcohol or use drugs.  Family History:  Family History  Problem Relation Age of Onset  . Diabetes Mellitus II Mother   . Dementia Father   . Diabetes Mellitus II Sister   . Cancer Brother        Bladder cancer     Prior to Admission medications   Medication Sig  Start Date End Date Taking? Authorizing Provider  acetaminophen (TYLENOL) 500 MG tablet Take 500 mg by mouth every 6 (six) hours as needed for mild pain.    Yes [provider]  aspirin EC 81 MG tablet Take 81 mg by mouth daily.   Yes [provider]  baclofen (LIORESAL) 10 MG tablet Take 10 mg by mouth 2 (two) times daily. 07/31/17  Yes [provider]  Cyanocobalamin (VITAMIN B 12 PO) Take 1,000 mcg by mouth daily.   Yes [provider]  doxepin (SINEQUAN) 10 MG capsule Take 10 mg by mouth at bedtime.   Yes [provider]  FLUoxetine (PROZAC) 20 MG capsule Take 20-40 mg by mouth See admin instructions. Takes  in morning and  in evening.   Yes [provider]  furosemide (LASIX) 20 MG tablet Take 60 mg by mouth daily.   Yes [provider]  gabapentin (NEURONTIN) 300 MG capsule Take 600 mg by mouth 3 (three) times daily.   Yes [provider]  insulin glargine (LANTUS) 100 UNIT/ML injection Inject 15 Units into the skin 2 (two) times daily.    Yes [provider]  insulin regular (HUMULIN R) 100 units/mL injection Inject 2-6 Units into the skin 3 (three) times daily before meals. Sliding scale   Yes [provider]  methimazole (TAPAZOLE) 5 MG tablet Take 5 mg by mouth every morning.    Yes [provider]  metoprolol tartrate (LOPRESSOR) 50 MG tablet Take 50 mg by mouth 2 (two) times daily.   Yes [provider]  mupirocin ointment (BACTROBAN) 2 % Place 1 application into the nose 2 (two) times daily.   Yes [provider]  nitroGLYCERIN (NITROSTAT) 0.4 MG SL tablet Place 0.4 mg under the tongue every 5 (five) minutes as needed for chest pain.   Yes [provider]  potassium chloride (K-DUR) 10 MEQ tablet Take 20 mEq by mouth daily. 05/16/17 05/16/18 Yes [provider]  pravastatin (PRAVACHOL) 80 MG tablet Take one tablet (80 mg total) by mouth daily. 04/07/17   Yes [provider]  propranolol (INDERAL) 40 MG tablet Take 40 mg by mouth 2 (two) times daily.   Yes [provider]  pyridoxine (B-6) 100 MG tablet Take 100 mg by mouth daily.   Yes [provider]  traMADol (ULTRAM) 50 MG tablet Take 1 -2 every 8 hours as needed for pain Patient taking differently: Take 50-100 mg by mouth every 8 (eight) hours as needed for moderate pain.  05/08/17  Yes Dorena Bodo, PA-C  valACYclovir (VALTREX) 500 MG tablet Take 500 mg by mouth daily. 07/31/17  Yes [provider]  Vitamin D, Ergocalciferol, (DRISDOL) 50000 units CAPS capsule TAKE 1 CAPSULE (50,000 UNITS TOTAL) BY MOUTH 2 (TWO) TIMES A WEEK. [facility] AND SUNDAY) 10/21/16  Yes [provider]  brimonidine (ALPHAGAN) 0.15 % ophthalmic solution  Place 1 drop into both eyes 3 (three) times a day.  12/06/14   [provider]  dicyclomine (BENTYL) 20 MG tablet Take 20 mg by mouth 2 (two) times daily. 02/27/17   [provider]  fluorometholone (FML) 0.1 % ophthalmic suspension USE 1 DROP IN LEFT EYE THREE TIMES A DAY 02/15/17   [provider]  isosorbide mononitrate (IMDUR) 60 MG 24 hr tablet TAKE 1 TABLET (60 MG TOTAL) BY MOUTH EVERY MORNING. 03/15/17   [provider]  Lifitegrast Benay Spice) 5 % SOLN Apply 1 drop to eye 2 (two) times daily.    [provider]  Loteprednol Etabonate (LOTEMAX) 0.5 % GEL Place 1 drop into both eyes daily.  08/07/14   [provider]  timolol (TIMOPTIC) 0.5 % ophthalmic solution Place 1 drop into both eyes 2 (two) times daily.    [provider]  TRAVATAN Z 0.004 % SOLN ophthalmic solution Place 1 drop into both eyes at bedtime. 06/30/17   [provider]  triamcinolone ointment (KENALOG) 0.1 % Apply to affected area twice per week 02/05/16   [provider]    Physical Exam: Vitals:   08/06/17 2317 08/06/17 2319 08/07/17 0200  BP:   (!) 176/58  Pulse:  61 74  Resp:   (!) 8 13  Temp:  98.2 F (36.8 C)   TempSrc:  Oral   SpO2: 92% 99% 100%   General: Not in acute distress HEENT:       Eyes: PERRL, EOMI, no scleral icterus.       ENT: No discharge from the ears and nose, no pharynx injection, no tonsillar enlargement.        Neck: No JVD, no bruit, no mass felt. Heme: No neck lymph node enlargement. Cardiac: S1/S2, RRR, No murmurs, No gallops or rubs. Respiratory: No rales, wheezing, rhonchi or rubs. GI: Soft, nondistended, nontender, no rebound pain, no organomegaly, BS present. GU: No hematuria Ext: has trace leg edema bilaterally. 2+DP/PT pulse bilaterally. Musculoskeletal: right leg is shortened and externally rotated. Tenderness to palpation of right distal femur.  Skin: No rashes.  Neuro: Alert, oriented X3, cranial nerves II-XII grossly intact, moves all extremities. Psych: Patient is not psychotic, no suicidal or hemocidal ideation.  Labs on Admission: I have personally reviewed following labs and imaging studies  CBC:  Recent Labs Lab 08/07/17 0115  WBC 14.9*  NEUTROABS 12.3*  HGB 11.1*  HCT 33.9*  MCV 93.9  PLT 302   Basic Metabolic Panel:  Recent Labs Lab 08/07/17 0115  NA 136  K 4.2  CL 101  CO2 24  GLUCOSE 359*  BUN 23*  CREATININE 1.53*  CALCIUM 9.2   GFR: CrCl cannot be calculated (Unknown ideal weight.). Liver Function Tests: No results for input(s): AST, ALT, ALKPHOS, BILITOT, PROT, ALBUMIN in the last 168 hours. No results for input(s): LIPASE, AMYLASE in the last 168 hours. No results for input(s): AMMONIA in the last 168 hours. Coagulation Profile:  Recent Labs Lab 08/07/17 0115  INR 1.00   Cardiac Enzymes: No results for input(s): CKTOTAL, CKMB, CKMBINDEX, TROPONINI in the last 168 hours. BNP (last 3 results) No results for input(s): PROBNP in the last 8760 hours. HbA1C: No results for input(s): HGBA1C in the last 72 hours. CBG: No results for input(s): GLUCAP in the last 168 hours. Lipid  Profile: No results for input(s): CHOL, HDL, LDLCALC, TRIG, CHOLHDL, LDLDIRECT in the last 72 hours. Thyroid Function Tests: No results for input(s): TSH, T4TOTAL, FREET4,  T3FREE, THYROIDAB in the last 72 hours. Anemia Panel: No results for input(s): VITAMINB12, FOLATE, FERRITIN, TIBC, IRON, RETICCTPCT in the last 72 hours. Urine analysis:    Component Value Date/Time   COLORURINE YELLOW 05/18/2017 1554   APPEARANCEUR CLEAR 05/18/2017 1554   LABSPEC 1.020 05/18/2017 1554   PHURINE 5.0 05/18/2017 1554   GLUCOSEU 2+ (A) 05/18/2017 1554   HGBUR TRACE (A) 05/18/2017 1554   BILIRUBINUR NEGATIVE 05/18/2017 1554   KETONESUR TRACE (A) 05/18/2017 1554   PROTEINUR 2+ (A) 05/18/2017 1554   NITRITE NEGATIVE 05/18/2017 1554   LEUKOCYTESUR NEGATIVE 05/18/2017 1554   Sepsis Labs: (procalcitonin:4,lacticidven:4) )No results found for this or any previous visit (from the past 240 hour(s)).   Radiological Exams on Admission: Dg Chest 1 View  Result Date: 08/07/2017 CLINICAL DATA:  Fall in Environmental health practitioner.  Right leg pain. EXAM: CHEST 1 VIEW COMPARISON:  None. FINDINGS: Left-sided pacemaker in place. Post median sternotomy with prosthetic aortic valve. The heart is enlarged. Low lung volumes. No pulmonary edema. Probable basilar atelectasis. No large pleural effusion. No pneumothorax. Surgical tacks in the right proximal humerus from presumed rotator cuff repair. No acute osseous abnormalities are seen. IMPRESSION: Cardiomegaly without evidence of failure. Mild bibasilar atelectasis. Electronically Signed   By: Rubye Oaks M.D.   On: 08/07/2017 00:34   Dg Tibia/fibula Right  Result Date: 08/07/2017 CLINICAL DATA:  Right lower leg pain after fall in kitchen floor tonight. EXAM: RIGHT TIBIA AND FIBULA - 2 VIEW COMPARISON:  None. FINDINGS: Distal femur fracture partially included, assessed on concurrent femur radiographs. The tibia and fibula appear intact without tibia or fibula  fracture. 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 The bones are under mineralized. Chronic change in the mid/ hindfoot is partially included. Advanced vascular calcifications and surgical clips in the medial soft tissues. IMPRESSION: No tibia or fibula fracture. Distal femur fracture is partially included. Electronically Signed   By: Rubye Oaks M.D.   On: 08/07/2017 00:32   Ct Head Wo Contrast  Result Date: 08/07/2017 CLINICAL DATA:  Patient slipped and fell at home this evening. Multiple falls recently due to neuropathy. EXAM: CT HEAD WITHOUT CONTRAST CT CERVICAL SPINE WITHOUT CONTRAST TECHNIQUE: Multidetector CT imaging of the head and cervical spine was performed following the standard protocol without intravenous contrast. Multiplanar CT image reconstructions of the cervical spine were also generated. COMPARISON:  04/27/2017 FINDINGS: CT HEAD FINDINGS Brain: Chronic mild-to-moderate small vessel ischemic disease of periventricular white matter with mild 16 superficial and central atrophy. No intra-axial mass nor extra-axial fluid collections. Fourth ventricle is midline. Vascular: Moderate atherosclerosis of the carotid siphons. No hyperdense vessels. Skull: Negative for fracture or focal lesion. Sinuses/Orbits: Left maxillary median antrostomy defect. Thickened left maxillary sinus walls with sinus atelectasis likely representing stigmata of chronic left maxillary sinusitis. Bilateral lens replacements. Intact orbits and globes. No retrobulbar abnormality. Other: Clear bilateral mastoids. Mild frontal scalp soft tissue swelling. CT CERVICAL SPINE FINDINGS Alignment: Normal. Skull base and vertebrae: No acute fracture. No primary bone lesion or focal pathologic process. Soft tissues and spinal canal: No prevertebral fluid or swelling. No visible canal hematoma. Disc levels: Mild central disc bulges C2-3, C3-4 and C4-5. Small disc - osteophyte complexes  C5-6 and C6-7. Multilevel degenerative facet arthropathy most prominently affecting the right C3-4 facet. Upper chest: Negative. Other: None IMPRESSION: 1. Mild superficial and central atrophy with chronic stable small vessel ischemic disease of periventricular and subcortical white matter. 2. Mild soft tissue  swelling of the forehead. 3. Left maxillary sinus postsurgical change with sinus atelectasis. 4. Lower cervical spondylosis at C5-6 and C6-7 with multilevel degenerative facet arthropathy more severely affecting the right C3-4 facet. 5. No acute cervical spine fracture or posttraumatic subluxation. Electronically Signed   By: Tollie Eth M.D.   On: 08/07/2017 01:00   Ct Cervical Spine Wo Contrast  Result Date: 08/07/2017 CLINICAL DATA:  Patient slipped and fell at home this evening. Multiple falls recently due to neuropathy. EXAM: CT HEAD WITHOUT CONTRAST CT CERVICAL SPINE WITHOUT CONTRAST TECHNIQUE: Multidetector CT imaging of the head and cervical spine was performed following the standard protocol without intravenous contrast. Multiplanar CT image reconstructions of the cervical spine were also generated. COMPARISON:  04/27/2017 FINDINGS: CT HEAD FINDINGS Brain: Chronic mild-to-moderate small vessel ischemic disease of periventricular white matter with mild 16 superficial and central atrophy. No intra-axial mass nor extra-axial fluid collections. Fourth ventricle is midline. Vascular: Moderate atherosclerosis of the carotid siphons. No hyperdense vessels. Skull: Negative for fracture or focal lesion. Sinuses/Orbits: Left maxillary median antrostomy defect. Thickened left maxillary sinus walls with sinus atelectasis likely representing stigmata of chronic left maxillary sinusitis. Bilateral lens replacements. Intact orbits and globes. No retrobulbar abnormality. Other: Clear bilateral mastoids. Mild frontal scalp soft tissue swelling. CT CERVICAL SPINE FINDINGS Alignment: Normal. Skull base and  vertebrae: No acute fracture. No primary bone lesion or focal pathologic process. Soft tissues and spinal canal: No prevertebral fluid or swelling. No visible canal hematoma. Disc levels: Mild central disc bulges C2-3, C3-4 and C4-5. Small disc - osteophyte complexes C5-6 and C6-7. Multilevel degenerative facet arthropathy most prominently affecting the right C3-4 facet. Upper chest: Negative. Other: None IMPRESSION: 1. Mild superficial and central atrophy with chronic stable small vessel ischemic disease of periventricular and subcortical white matter. 2. Mild soft tissue swelling of the forehead. 3. Left maxillary sinus postsurgical change with sinus atelectasis. 4. Lower cervical spondylosis at C5-6 and C6-7 with multilevel degenerative facet arthropathy more severely affecting the right C3-4 facet. 5. No acute cervical spine fracture or posttraumatic subluxation. Electronically Signed   By: Tollie Eth M.D.   On: 08/07/2017 01:00   Dg Femur Min 2 Views Right  Result Date: 08/07/2017 CLINICAL DATA:  Larey Seat on Environmental health practitioner. EXAM: RIGHT FEMUR 2 VIEWS COMPARISON:  None. FINDINGS: Acute comminuted distal femur metadiaphyseal fracture with lateral angulation distal bony fragments. Osteopenia. Advanced degenerative changes included knee. No dislocation. No destructive bony lesions. Severe vascular calcifications. IMPRESSION: Acute displaced distal femur fracture.  No dislocation. Electronically Signed   By: Awilda Metro M.D.   On: 08/07/2017 00:27     EKG: Independently reviewed.  Sinus rhythm, QTC 537,  biphasicular block   Assessment/Plan Principal Problem:   Closed fracture of right distal femur (HCC) Active Problems:   Hyperthyroidism   Hyperlipidemia   Type II diabetes mellitus with renal manifestations (HCC)   Depression   CAD (coronary artery disease)   Peripheral neuropathy   CKD (chronic kidney disease), stage III   Fall   Leukocytosis   Chronic diastolic CHF (congestive  heart failure) (HCC)   Fall and closed fracture of right distal femur (HCC):  As evidenced by x-ray. Patient has severe pain now. No neurovascular compromise. Orthopedic surgeon, Dr. Jena Gauss was consulted.   - will admit to tele bed - Pain control: morphine prn and percocet - When necessary hydroxyzine for nausea - Robaxin for muscle spasm - type and cross - INR/PTT - Pt/ot when able to  Leukocytosis: Likely due to stress-induced demargination. No fever. Patient does not have signs of infection. -Follow-up CBC  Hyperthyroidism: Last TSH was 4.48 and free T4 1.87 which is slightly higher -Continue home metoprolol and propranolol  HLD: -Pravastatin  Diabetes mellitus with renal complication: A1c 9.8 on 05/19/17, poorly controlled. Patient is taking Lantus and Humulin at home. -Decrease Lantus dose from 15 to 10 U twice a day -Sliding scale insulin  Depression: Stable, no suicidal or homicidal ideations. -Continue home medications: Prozac  CAD: s/p of CABG. No CP -continue metoprolol, pravastatin -when necessary nitroglycerin -Hold aspirin for surgery  Peripheral neuropathy: -Neurontin  CKD (chronic kidney disease), stage III: Stable. Creatinine was 1.8 on 05/18/17. Her creatinine is 1.53 on admission. BUN 23. -Follow-up renal function by BMP  Chronic diastolic CHF (congestive heart failure) [facility]): Patient has trace leg edema, but no JVP. No respiratory distress. CHF is compensated. 2 echo on 03/13/16 showed EF of 70-75 percent -Continue Lasix and metoprolol -Check BNP   DVT ppx: SCD Code Status: Full code Family Communication: Yes, patient's husband at bed side Disposition Plan:  Anticipate discharge back to previous home environment Consults called:  Ortho, Dr. Jena Gauss Admission status: Inpatient/tele      Date of Service 08/07/2017    Lorretta Harp [facility] Pager [phone]  If 7PM-7AM, please contact night-coverage [email or web address] Password [email or web address] 08/07/2017,  3:20 AM

## 2017-08-07 NOTE — ED Provider Notes (Signed)
[facility]-EMERGENCY DEPT Provider Note   CSN: [id number] Arrival date & time: 08/06/17  2313     History   Chief Complaint Chief Complaint  Patient presents with  . Fall   LEVEL 5 CAVEAT DUE TO CONFUSION HPI Sonya Campbell is a 71 y.o. female.  The history is provided by the patient and the spouse.  Fall  This is a new problem. Episode onset: JUST PRIOR TO ARRIVAL. The problem occurs constantly. The problem has been gradually worsening. Associated symptoms include headaches. Pertinent negatives include no chest pain and no abdominal pain. The symptoms are aggravated by twisting. The symptoms are relieved by rest.  pt presents s/p falls She claims that water was leaking for fridge and she slipped and fell Husband heard her scream when she fell She also reports pain in right thigh/knee from fall She is now reporting mild HA and neck pain She reports increased falls lately, perhaps related to peripheral neuropathy  Per husband, pt has had increasing levels of confusion lately   Past Medical History:  Diagnosis Date  . CAD (coronary artery disease)   . Chronic kidney disease    stage 3  . Depression   . Diabetes mellitus without complication (HCC)   . Fibromyalgia   . Hyperlipidemia   . Hyperthyroidism   . 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 Neuropathy     There are no active problems to display for this patient.   Past Surgical History:  Procedure Laterality Date  . 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 CORONARY ARTERY BYPASS GRAFT    . KNEE SURGERY     Left knee    OB History    No data available       Home Medications    Prior to Admission medications   Medication Sig Start Date End Date Taking? Authorizing Provider  acetaminophen (TYLENOL) 500 MG tablet Take 500 mg by mouth every 6 (six) hours as needed.    [provider]  aspirin 325 MG  tablet Take 325 mg by mouth daily.    [provider]  brimonidine (ALPHAGAN) 0.15 % ophthalmic solution Place 1 drop into both eyes 3 (three) times a day.  12/06/14   [provider]  Cyanocobalamin (VITAMIN B 12 PO) Take 1,000 mcg by mouth daily.    [provider]  dicyclomine (BENTYL) 20 MG tablet Take 20 mg by mouth 2 (two) times daily. 02/27/17   [provider]  doxepin (SINEQUAN) 10 MG capsule Take 10 mg by mouth at bedtime.    [provider]  fluorometholone (FML) 0.1 % ophthalmic suspension 1 drop in left eye three times a day 02/15/17   [provider]  FLUoxetine (PROZAC) 20 MG capsule Take 60 mg by mouth daily. Takes 40mg  in morning and 20mg  in evening.    [provider]  furosemide (LASIX) 20 MG tablet Take 60 mg by mouth daily.    [provider]  gabapentin (NEURONTIN) 300 MG capsule Take 600 mg by mouth 3 (three) times daily.    [provider]  insulin glargine (LANTUS) 100 UNIT/ML injection Inject 15 Units into the skin 2 (two) times daily.     [provider]  insulin regular (HUMULIN R) 100 units/mL injection Inject 2-6 Units into the skin 3 (three) times daily before meals. Sliding scale    [provider]  isosorbide mononitrate (IMDUR) 60 MG 24 hr tablet TAKE 1 TABLET (60 MG TOTAL) BY MOUTH EVERY MORNING. 03/15/17   [provider]  Lifitegrast Benay Spice) 5 % SOLN Apply 1 drop to eye 2 (two) times daily.    [provider]  loteprednol (LOTEMAX) 0.5 % ophthalmic suspension  06/30/16   [provider]  Loteprednol Etabonate (LOTEMAX) 0.5 % GEL Place 1 drop into both eyes daily.  08/07/14   [provider]  methimazole (TAPAZOLE) 5 MG tablet Take 5 mg by mouth every morning.     [provider]  metoprolol tartrate (LOPRESSOR) 50 MG tablet Take 50 mg by mouth 2 (two) times daily.    [provider]  mupirocin ointment (BACTROBAN) 2 %  Place 1 application into the nose 2 (two) times daily.    [provider]  nitroGLYCERIN (NITROSTAT) 0.4 MG SL tablet Place 0.4 mg under the tongue every 5 (five) minutes as needed for chest pain.    [provider]  potassium chloride (K-DUR) 10 MEQ tablet Take by mouth. 12/05/16   [provider]  pravastatin (PRAVACHOL) 80 MG tablet Take one tablet (80 mg total) by mouth daily. 04/07/17   [provider]  propranolol (INDERAL) 40 MG tablet Take 40 mg by mouth 2 (two) times daily.    [provider]  pyridoxine (B-6) 100 MG tablet Take 100 mg by mouth daily.    [provider]  timolol (TIMOPTIC) 0.5 % ophthalmic solution Place 1 drop into both eyes 2 (two) times daily.    [provider]  traMADol (ULTRAM) 50 MG tablet 1-2 tabs every 6 hrs for pain 09/12/14   [provider]  traMADol (ULTRAM) 50 MG tablet Take 1 -2 every 8 hours as needed for pain 05/08/17   Allayne Butcher B, PA-C  triamcinolone ointment (KENALOG) 0.1 % Apply to affected area twice per week (use Secura on all other days) 02/05/16   [provider]  valACYclovir (VALTREX) 500 MG tablet Take by mouth.    [provider]  Vitamin D, Ergocalciferol, (DRISDOL) 50000 units CAPS capsule TAKE 1 CAPSULE (50,000 UNITS TOTAL) BY MOUTH 2 (TWO) TIMES A WEEK. [facility] AND SUNDAY) 10/21/16   [provider]  warfarin (COUMADIN) 5 MG tablet Coumadin to 7.5 mg on Tuesday, Thursday, Saturday. Continue 5 mg on Monday, Wednesday, Friday, and Sunday. 03/31/17   [provider]    Family History No family history on file.  Social History Social History  Substance Use Topics  . Smoking status: Former Games developer  . Smokeless tobacco: Former Neurosurgeon  . Alcohol use No     Allergies   Shrimp [shellfish allergy]; Beef-derived products; Benadryl [diphenhydramine]; Ceclor [cefaclor]; Cymbalta [duloxetine hcl]; Levaquin [levofloxacin]; Lipitor  [atorvastatin]; Metformin and related; Other; and Pork-derived products   Review of Systems Review of Systems  Constitutional: Negative for fever.  Cardiovascular: Negative for chest pain.  Gastrointestinal: Negative for abdominal pain.  Neurological: Positive for headaches.  All other systems reviewed and are negative.    Physical Exam Updated Vital Signs Pulse 61   Temp 98.2 F (36.8 C) (Oral)   Resp (!) 8   SpO2 99%   Physical Exam CONSTITUTIONAL: elderly, no distress HEAD: Normocephalic/atraumatic, diffuse tenderness noted EYES: EOMI/PERRL ENMT: Mucous membranes moist NECK: supple no meningeal signs SPINE/BACK:cervical spine tenderness CV: S1/S2 noted LUNGS: Lungs are clear to auscultation bilaterally, no apparent distress Chest - no tenderness ABDOMEN: soft, nontender, no rebound or guarding, bowel  sounds noted throughout abdomen GU:no cva tenderness NEURO: Pt is awake/alert, she appears mildly confused, moves all extremitiesx4.  No facial droop.   EXTREMITIES: right LE - it is shortened and externally rotated.  Distal  Cap refill less than 2 seconds.  Tenderness to palpation of right femur/tib surface.  All other extremities/joints palpated/ranged and nontender SKIN: warm, color normal PSYCH: no abnormalities of mood noted  ED Treatments / Results  Labs (all labs ordered are listed, but only abnormal results are displayed) Labs Reviewed  BASIC METABOLIC PANEL - Abnormal; Notable for the following:       Result Value   Glucose, Bld 359 (*)    BUN 23 (*)    Creatinine, Ser 1.53 (*)    GFR calc non Af Amer 33 (*)    GFR calc Af Amer 38 (*)    All other components within normal limits  CBC WITH DIFFERENTIAL/PLATELET - Abnormal; Notable for the following:    WBC 14.9 (*)    RBC 3.61 (*)    Hemoglobin 11.1 (*)    HCT 33.9 (*)    Neutro Abs 12.3 (*)    All other components within normal limits  PROTIME-INR  TYPE AND SCREEN  ABO/RH    EKG  EKG  Interpretation  Date/Time:  Monday August 07 2017 01:18:05 EDT Ventricular Rate:  60 PR Interval:    QRS Duration: 156 QT Interval:  537 QTC Calculation: 537 R Axis:   -69 Text Interpretation:  Sinus rhythm RBBB and LAFB Left ventricular hypertrophy Abnormal ekg Confirmed by Zadie Rhine ([id number]) on 08/07/2017 1:57:54 AM       Radiology Dg Chest 1 View  Result Date: 08/07/2017 CLINICAL DATA:  Fall in Environmental health practitioner.  Right leg pain. EXAM: CHEST 1 VIEW COMPARISON:  None. FINDINGS: Left-sided pacemaker in place. Post median sternotomy with prosthetic aortic valve. The heart is enlarged. Low lung volumes. No pulmonary edema. Probable basilar atelectasis. No large pleural effusion. No pneumothorax. Surgical tacks in the right proximal humerus from presumed rotator cuff repair. No acute osseous abnormalities are seen. IMPRESSION: Cardiomegaly without evidence of failure. Mild bibasilar atelectasis. Electronically Signed   By: Rubye Oaks M.D.   On: 08/07/2017 00:34   Dg Tibia/fibula Right  Result Date: 08/07/2017 CLINICAL DATA:  Right lower leg pain after fall in kitchen floor tonight. EXAM: RIGHT TIBIA AND FIBULA - 2 VIEW COMPARISON:  None. FINDINGS: Distal femur fracture partially included, assessed on concurrent femur radiographs. The tibia and fibula appear intact without tibia or fibula fracture. 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 The bones are under mineralized. Chronic change in the mid/ hindfoot is partially included. Advanced vascular calcifications and surgical clips in the medial soft tissues. IMPRESSION: No tibia or fibula fracture. Distal femur fracture is partially included. Electronically Signed   By: Rubye Oaks M.D.   On: 08/07/2017 00:32   Ct Head Wo Contrast  Result Date: 08/07/2017 CLINICAL DATA:  Patient slipped and fell at home this evening. Multiple falls recently due to neuropathy. EXAM: CT HEAD  WITHOUT CONTRAST CT CERVICAL SPINE WITHOUT CONTRAST TECHNIQUE: Multidetector CT imaging of the head and cervical spine was performed following the standard protocol without intravenous contrast. Multiplanar CT image reconstructions of the cervical spine were also generated. COMPARISON:  04/27/2017 FINDINGS: CT HEAD FINDINGS Brain: Chronic mild-to-moderate small vessel ischemic disease of periventricular white matter with mild 16 superficial and central atrophy. No intra-axial mass nor extra-axial fluid collections. Fourth ventricle is  midline. Vascular: Moderate atherosclerosis of the carotid siphons. No hyperdense vessels. Skull: Negative for fracture or focal lesion. Sinuses/Orbits: Left maxillary median antrostomy defect. Thickened left maxillary sinus walls with sinus atelectasis likely representing stigmata of chronic left maxillary sinusitis. Bilateral lens replacements. Intact orbits and globes. No retrobulbar abnormality. Other: Clear bilateral mastoids. Mild frontal scalp soft tissue swelling. CT CERVICAL SPINE FINDINGS Alignment: Normal. Skull base and vertebrae: No acute fracture. No primary bone lesion or focal pathologic process. Soft tissues and spinal canal: No prevertebral fluid or swelling. No visible canal hematoma. Disc levels: Mild central disc bulges C2-3, C3-4 and C4-5. Small disc - osteophyte complexes C5-6 and C6-7. Multilevel degenerative facet arthropathy most prominently affecting the right C3-4 facet. Upper chest: Negative. Other: None IMPRESSION: 1. Mild superficial and central atrophy with chronic stable small vessel ischemic disease of periventricular and subcortical white matter. 2. Mild soft tissue swelling of the forehead. 3. Left maxillary sinus postsurgical change with sinus atelectasis. 4. Lower cervical spondylosis at C5-6 and C6-7 with multilevel degenerative facet arthropathy more severely affecting the right C3-4 facet. 5. No acute cervical spine fracture or posttraumatic  subluxation. Electronically Signed   By: Tollie Eth M.D.   On: 08/07/2017 01:00   Ct Cervical Spine Wo Contrast  Result Date: 08/07/2017 CLINICAL DATA:  Patient slipped and fell at home this evening. Multiple falls recently due to neuropathy. EXAM: CT HEAD WITHOUT CONTRAST CT CERVICAL SPINE WITHOUT CONTRAST TECHNIQUE: Multidetector CT imaging of the head and cervical spine was performed following the standard protocol without intravenous contrast. Multiplanar CT image reconstructions of the cervical spine were also generated. COMPARISON:  04/27/2017 FINDINGS: CT HEAD FINDINGS Brain: Chronic mild-to-moderate small vessel ischemic disease of periventricular white matter with mild 16 superficial and central atrophy. No intra-axial mass nor extra-axial fluid collections. Fourth ventricle is midline. Vascular: Moderate atherosclerosis of the carotid siphons. No hyperdense vessels. Skull: Negative for fracture or focal lesion. Sinuses/Orbits: Left maxillary median antrostomy defect. Thickened left maxillary sinus walls with sinus atelectasis likely representing stigmata of chronic left maxillary sinusitis. Bilateral lens replacements. Intact orbits and globes. No retrobulbar abnormality. Other: Clear bilateral mastoids. Mild frontal scalp soft tissue swelling. CT CERVICAL SPINE FINDINGS Alignment: Normal. Skull base and vertebrae: No acute fracture. No primary bone lesion or focal pathologic process. Soft tissues and spinal canal: No prevertebral fluid or swelling. No visible canal hematoma. Disc levels: Mild central disc bulges C2-3, C3-4 and C4-5. Small disc - osteophyte complexes C5-6 and C6-7. Multilevel degenerative facet arthropathy most prominently affecting the right C3-4 facet. Upper chest: Negative. Other: None IMPRESSION: 1. Mild superficial and central atrophy with chronic stable small vessel ischemic disease of periventricular and subcortical white matter. 2. Mild soft tissue swelling of the forehead. 3.  Left maxillary sinus postsurgical change with sinus atelectasis. 4. Lower cervical spondylosis at C5-6 and C6-7 with multilevel degenerative facet arthropathy more severely affecting the right C3-4 facet. 5. No acute cervical spine fracture or posttraumatic subluxation. Electronically Signed   By: Tollie Eth M.D.   On: 08/07/2017 01:00   Dg Femur Min 2 Views Right  Result Date: 08/07/2017 CLINICAL DATA:  Larey Seat on Environmental health practitioner. EXAM: RIGHT FEMUR 2 VIEWS COMPARISON:  None. FINDINGS: Acute comminuted distal femur metadiaphyseal fracture with lateral angulation distal bony fragments. Osteopenia. Advanced degenerative changes included knee. No dislocation. No destructive bony lesions. Severe vascular calcifications. IMPRESSION: Acute displaced distal femur fracture.  No dislocation. Electronically Signed   By: Awilda Metro M.D.   On:  08/07/2017 00:27    Procedures Procedures (including critical care time)  Medications Ordered in ED Medications  fentaNYL (SUBLIMAZE) injection 50 mcg (50 mcg Intravenous Given 08/07/17 0100)  fentaNYL (SUBLIMAZE) injection 50 mcg (50 mcg Intravenous Given 08/07/17 0219)     Initial Impression / Assessment and Plan / ED Course  I have reviewed the triage vital signs and the nursing notes.  Pertinent labs & imaging results that were available during my care of the patient were reviewed by me and considered in my medical decision making (see chart for details).     12:10 AM I am concerned for femur fracture Due to confusion and has HA/neck pain/ CT imaging ordered 12:45 AM D/w dr haddix with ortho He will see patient Likely OR later today Place 10lbs bucks traction 2:30 AM Seen by ortho Pt improved with traction Distal cap refill less than 2 seconds on right foot  D/w dr Clyde Lundborg for admission  Final Clinical Impressions(s) / ED Diagnoses   Final diagnoses:  Fall, initial encounter  Closed bicondylar fracture of right femur, initial encounter  (HCC)  Hyperthyroidism  Hyperlipidemia, unspecified hyperlipidemia type  Diabetes mellitus without complication (HCC)  Hyperglycemia    New Prescriptions New Prescriptions   No medications on file     Zadie Rhine, MD 08/07/17 0231

## 2017-08-07 NOTE — Anesthesia Postprocedure Evaluation (Signed)
Anesthesia Post Note  Patient: Sonya Campbell  Procedure(s) Performed: Procedure(s) (LRB): OPEN REDUCTION INTERNAL FIXATION (ORIF) DISTAL FEMUR FRACTURE (Right)     Patient location during evaluation: PACU Anesthesia Type: General Level of consciousness: sedated, responds to stimulation and confused (pt remains somnolent, similar to pre-op mental status) Pain management: pain level controlled Vital Signs Assessment: post-procedure vital signs reviewed and stable Respiratory status: spontaneous breathing, nonlabored ventilation, respiratory function stable and patient connected to nasal cannula oxygen Cardiovascular status: blood pressure returned to baseline and stable Postop Assessment: no apparent nausea or vomiting Anesthetic complications: no    Last Vitals:  Vitals:   08/07/17 1702 08/07/17 1717  BP: (!) 156/66 (!) 157/61  Pulse: 60 (!) 59  Resp: 14 15  Temp:    SpO2: 97% 98%    Last Pain:  Vitals:   08/07/17 1700  TempSrc:   PainSc: Asleep                 Sharlee Rufino,E. Ammanda Dobbins

## 2017-08-07 NOTE — Transfer of Care (Signed)
Immediate Anesthesia Transfer of Care Note  Patient: Sonya Campbell  Procedure(s) Performed: Procedure(s): OPEN REDUCTION INTERNAL FIXATION (ORIF) DISTAL FEMUR FRACTURE (Right)  Patient Location: PACU  Anesthesia Type:General  Level of Consciousness: lethargic and responds to stimulation  Airway & Oxygen Therapy: Patient Spontanous Breathing and Patient connected to nasal cannula oxygen  Post-op Assessment: Report given to RN and Post -op Vital signs reviewed and stable  Post vital signs: Reviewed and stable  Last Vitals:  Vitals:   08/07/17 0745 08/07/17 1147  BP: (!) 169/63 (!) 140/53  Pulse: 67 67  Resp: 13 15  Temp:    SpO2: 96% 99%    Last Pain:  Vitals:   08/07/17 0600  TempSrc:   PainSc: 3          Complications: No apparent anesthesia complications

## 2017-08-07 NOTE — ED Notes (Signed)
Attempted report at this time.  No one answered the phone.  Range multiple times.

## 2017-08-07 NOTE — Progress Notes (Signed)
Dr Jena Gauss called and asked about allergy noted to Ceclor and ancef ordered states ok to give ancef. Family states the only reaction she has had in the past was itching severe at times.

## 2017-08-07 NOTE — Anesthesia Procedure Notes (Addendum)
Procedure Name: Intubation Date/Time: 08/07/2017 1:37 PM Performed by: Annye Asa Pre-anesthesia Checklist: Patient identified, Emergency Drugs available, Suction available and Patient being monitored Patient Re-evaluated:Patient Re-evaluated prior to induction Oxygen Delivery Method: Circle System Utilized Preoxygenation: Pre-oxygenation with 100% oxygen Induction Type: IV induction Ventilation: Mask ventilation without difficulty Laryngoscope Size: Mac and 3 Grade View: Grade II Tube type: Oral Tube size: 7.5 mm Number of attempts: 2 Airway Equipment and Method: Stylet and Oral airway Placement Confirmation: ETT inserted through vocal cords under direct vision,  positive ETCO2 and breath sounds checked- equal and bilateral Secured at: 20 cm Tube secured with: Tape Dental Injury: Teeth and Oropharynx as per pre-operative assessment  Comments: DL x1 by CRNA with MAC 3, grade 3 view, unable to advanced ETT due to try tissues; DLx2 by MD with MAC 3, grade 2 view, atraumatic intubation. BBS=, +ETCO2.

## 2017-08-07 NOTE — Progress Notes (Signed)
Spoke with Sonya Campbell ([phone]). He states the pacemaker does not need interrogation despite interference and magnet use. Notified Dr.Jackson. No new orders this time. Pt is stable. Pacing at 60. Will continue to monitor

## 2017-08-07 NOTE — ED Notes (Signed)
Attempted to call report x2

## 2017-08-07 NOTE — ED Notes (Signed)
Consent signed at this time.

## 2017-08-07 NOTE — Progress Notes (Addendum)
Initial Nutrition Assessment  DOCUMENTATION CODES:   Obesity unspecified  INTERVENTION:   Monitor for diet advancement/toleration  Provide Glucerna Shake po TID, each supplement provides 220 kcal and 10 grams of protein once diet is advanced.   NUTRITION DIAGNOSIS:   Increased nutrient needs related to wound healing as evidenced by estimated needs.  GOAL:   Patient will meet greater than or equal to 90% of their needs  MONITOR:   PO intake, Supplement acceptance, Weight trends, Labs, Diet advancement  REASON FOR ASSESSMENT:   Consult Hip fracture protocol  ASSESSMENT:   Pt with PMH significant for HTN, HLD, dementia, diastolic CHF, s/p aortic valve replacement, CKD III, CAD, CABG, DM (insulin dependent), peripheral neuropathy, and hypothyroidism. Presents this admission after a mechanical fall at home resulting in closed fracture of right distal femur (repair scheduled 9/24 12 pm).   Pt lethargic upon visit. Spoke with daughter at bedside to obtain nutrition related history. Daughter reports pt has on and off appetite. Typically pt consumes 2 meals per day that consist of eggs, bacon, sandwiches, and soups. Ensure/Boost is kept at the house but daughter unsure if pt drinks them. Pt lives at home with her husband who does most of the cooking. Daughter reports pt has maintained her current weight for the last 6 months. Records indicate pt's wt stable since [place] 2018. RD attempted to obtain new wt at bedside. Could not obtain due to multiple items weighing down on the bed. A limited Nutrition-Focused physical exam was completed. Findings are no fat depletion, mild muscle depletion in temple region, and mild edema in BLE.   Medications reviewed and include: Vit B6, Vit B12, SSI Labs reviewed: CBG 359 (H) BUN 23 (H) Creatinine 1.53 (H)   Diet Order:  Diet NPO time specified Except for: Ice Chips, Sips with Meds  Skin:  Reviewed, no issues  Last BM:  PTA  Height:   Ht Readings  from Last 1 Encounters:  08/07/17  (1.575 m)    Weight:   Wt Readings from Last 1 Encounters:  05/18/17 177 lb (80.3 kg)    Ideal Body Weight:  50 kg  BMI:  There is no height or weight on file to calculate BMI.  Estimated Nutritional Needs:   Kcal:  1400-1600 kcal/day  Protein:  75-85 g/day  Fluid:  >1.4 L/day  EDUCATION NEEDS:   No education needs identified at this time  Vanessa Kick RD, LDN Clinical Nutrition Pager # - [phone]

## 2017-08-07 NOTE — Progress Notes (Addendum)
The patient was seen and examined at the bedside. She is ready to go to OR. Patient was admitted early this morning, please see H&P by Dr. Clyde Lundborg for detail.  71 year old female with history of hypertension, dyslipidemia, depression, dementia, chronic diastolic congestive heart failure, status post aVR, bovine valve, fibromyalgia, chronic kidney disease stage III, CAD, CABG, hyperparathyroidism presented after a fall sustaining a right thigh pain. Patient with a closed fracture of right distal femur. Already evaluated by orthopedics. Plan for or today. Patient received pain medication and looked comfortable. Gen.: She is lying flat. Denied chest pain or shortness of breath. Patient has no edema. Patient is currently nothing by mouth. Blood pressure 140/53 Blood sugar level elevated. Ordered NovoLog. Monitor blood sugar level closely.  EKG with a bundle branch block, LVH and sinus rhythm. No prior EKG to compare. Patient looks euvolemic on exam. Patient's daughter reported that the kitchen floor was slippery with water where patient slipped and fell. No prodromal symptoms including headache, dizziness, chest pain or shortness of breath. Patient could walk at home without any symptoms. Given multiple cardiac condition patient is at moderate cardiac risk for intermediate risk surgery Hold lasix before the surgery, will resume from tomorrow. I do not think patient needs cardiac interventions before the surgery.

## 2017-08-08 ENCOUNTER — Inpatient Hospital Stay (HOSPITAL_COMMUNITY): Payer: Medicare Other

## 2017-08-08 ENCOUNTER — Encounter (HOSPITAL_COMMUNITY): Payer: Self-pay | Admitting: Student

## 2017-08-08 DIAGNOSIS — I5032 Chronic diastolic (congestive) heart failure: Secondary | ICD-10-CM

## 2017-08-08 DIAGNOSIS — E119 Type 2 diabetes mellitus without complications: Secondary | ICD-10-CM

## 2017-08-08 DIAGNOSIS — R401 Stupor: Secondary | ICD-10-CM

## 2017-08-08 DIAGNOSIS — G9341 Metabolic encephalopathy: Secondary | ICD-10-CM

## 2017-08-08 LAB — GLUCOSE, CAPILLARY
GLUCOSE-CAPILLARY: 197 mg/dL — AB (ref 65–99)
Glucose-Capillary: 173 mg/dL — ABNORMAL HIGH (ref 65–99)
Glucose-Capillary: 175 mg/dL — ABNORMAL HIGH (ref 65–99)
Glucose-Capillary: 177 mg/dL — ABNORMAL HIGH (ref 65–99)

## 2017-08-08 LAB — BLOOD GAS, ARTERIAL
Acid-Base Excess: 0 mmol/L (ref 0.0–2.0)
Bicarbonate: 24.5 mmol/L (ref 20.0–28.0)
DRAWN BY: 419771
O2 Content: 3 L/min
O2 Saturation: 95.4 %
PH ART: 7.378 (ref 7.350–7.450)
Patient temperature: 98.6
pCO2 arterial: 42.6 mmHg (ref 32.0–48.0)
pO2, Arterial: 78.8 mmHg — ABNORMAL LOW (ref 83.0–108.0)

## 2017-08-08 LAB — URINE CULTURE: Culture: NO GROWTH

## 2017-08-08 LAB — CBC
HCT: 25.9 % — ABNORMAL LOW (ref 36.0–46.0)
HEMATOCRIT: 25.9 % — AB (ref 36.0–46.0)
HEMOGLOBIN: 8.5 g/dL — AB (ref 12.0–15.0)
Hemoglobin: 8.7 g/dL — ABNORMAL LOW (ref 12.0–15.0)
MCH: 31.9 pg (ref 26.0–34.0)
MCH: 31.3 pg (ref 26.0–34.0)
MCHC: 33.6 g/dL (ref 30.0–36.0)
MCHC: 32.8 g/dL (ref 30.0–36.0)
MCV: 94.9 fL (ref 78.0–100.0)
MCV: 95.2 fL (ref 78.0–100.0)
PLATELETS: 244 10*3/uL (ref 150–400)
Platelets: 215 10*3/uL (ref 150–400)
RBC: 2.73 MIL/uL — ABNORMAL LOW (ref 3.87–5.11)
RBC: 2.72 MIL/uL — ABNORMAL LOW (ref 3.87–5.11)
RDW: 13.2 % (ref 11.5–15.5)
RDW: 13.5 % (ref 11.5–15.5)
WBC: 15.2 10*3/uL — AB (ref 4.0–10.5)
WBC: 15.1 10*3/uL — ABNORMAL HIGH (ref 4.0–10.5)

## 2017-08-08 LAB — BASIC METABOLIC PANEL
ANION GAP: 9 (ref 5–15)
BUN: 30 mg/dL — AB (ref 6–20)
CHLORIDE: 106 mmol/L (ref 101–111)
CO2: 24 mmol/L (ref 22–32)
Calcium: 8.5 mg/dL — ABNORMAL LOW (ref 8.9–10.3)
Creatinine, Ser: 1.65 mg/dL — ABNORMAL HIGH (ref 0.44–1.00)
GFR calc Af Amer: 34 mL/min — ABNORMAL LOW (ref >60)
GFR, EST NON AFRICAN AMERICAN: 30 mL/min — AB (ref >60)
GLUCOSE: 186 mg/dL — AB (ref 65–99)
POTASSIUM: 3.9 mmol/L (ref 3.5–5.1)
Sodium: 139 mmol/L (ref 135–145)

## 2017-08-08 MED ORDER — ACETAMINOPHEN 650 MG RE SUPP: 650.0000 mg | RECTAL | Status: DC | PRN

## 2017-08-08 MED ORDER — SODIUM CHLORIDE 0.9 % IV BOLUS (SEPSIS)
1000.0000 mL | Freq: Once | INTRAVENOUS | Status: AC
  Administered 2017-08-08: 1000 mL via INTRAVENOUS

## 2017-08-08 MED ORDER — GABAPENTIN 300 MG PO CAPS
300.0000 mg | ORAL_CAPSULE | Freq: Two times a day (BID) | ORAL | Status: DC
  Administered 2017-08-09 – 2017-08-11 (×4): 300 mg via ORAL
  Filled 2017-08-08 (×5): qty 1

## 2017-08-08 MED ORDER — GABAPENTIN 300 MG PO CAPS: 300.0000 mg | ORAL_CAPSULE | Freq: Three times a day (TID) | ORAL | Status: DC

## 2017-08-08 MED ORDER — INSULIN GLARGINE 100 UNIT/ML ~~LOC~~ SOLN
10.0000 [IU] | Freq: Two times a day (BID) | SUBCUTANEOUS | Status: DC
  Administered 2017-08-08 – 2017-08-10 (×4): 10 [IU] via SUBCUTANEOUS
  Filled 2017-08-08 (×4): qty 0.1

## 2017-08-08 MED ORDER — ORAL CARE MOUTH RINSE
15.0000 mL | Freq: Two times a day (BID) | OROMUCOSAL | Status: DC
  Administered 2017-08-08 – 2017-08-13 (×8): 15 mL via OROMUCOSAL

## 2017-08-08 MED ORDER — ENOXAPARIN SODIUM 30 MG/0.3ML ~~LOC~~ SOLN
30.0000 mg | SUBCUTANEOUS | Status: DC
  Administered 2017-08-08 – 2017-08-13 (×6): 30 mg via SUBCUTANEOUS
  Filled 2017-08-08 (×6): qty 0.3

## 2017-08-08 NOTE — Evaluation (Signed)
Physical Therapy Evaluation Patient Details Name: Sonya Campbell MRN: [id number] DOB: 19-Jun-1946 Today's Date: 08/08/2017   History of Present Illness  Sonya Campbell is an 71 y.o. female with HTN, CAD, CKD stage 3, CHF, PCM implant, Neuropathy, Dementia, hyperthyroidism admitted on 9.24.2018 for right distal femur fracture after mechanical fall. She underwent ORIF under general anesthesia. She is lethargic post op which was attributed to GA and was undergoing frequent neurochecks. At 5.15 am Rapid response was called as patient was more lethargic, difficult to arouse with sternal rub and no longer following any commands.there is also questionable right facial droop noted as well as left eye twitching.  Neurology was notified of change in mental status.  Past Medical History:  Diagnosis Date  . CAD (coronary artery disease)   . Chronic diastolic (congestive) heart failure (HCC)   . Chronic kidney disease    stage 3  . Depression   . Diabetes mellitus without complication (HCC)   . Fibromyalgia   . Hyperlipidemia   . Hyperthyroidism   . 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 Neuropathy     Clinical Impression  Pt admitted with above diagnosis. Pt currently with functional limitations due to the deficits listed below (see PT Problem List). Pt was lethargic today and had difficulty participating.  Total assist to come to EOB and lacked initiation of any movement at EOB x 10 min.  Only opened eyes to command but would not track.  Will follow acutely.  Will most likely need SNF  Husband aware of need to build ramp and is initiating gettting it built.  60-64 bpm, 98% 3LO2, BP initially 105/63on arrival.  121/47 just prior to sitting up.  138/62 once back in supine.  Pt will benefit from skilled PT to increase their independence and safety with mobility to allow discharge to the venue listed below.     Follow Up Recommendations SNF;Supervision/Assistance - 24  hour    Equipment Recommendations  None recommended by PT    Recommendations for Other Services       Precautions / Restrictions Precautions Precautions: Fall Restrictions Weight Bearing Restrictions: Yes RLE Weight Bearing: Touchdown weight bearing      Mobility  Bed Mobility Overal bed mobility: Needs Assistance Bed Mobility: Supine to Sit     Supine to sit: Total assist;+2 for physical assistance     General bed mobility comments: Did not help at all with getting to EOB.    Transfers                 General transfer comment: unable as pt too lethargic  Ambulation/Gait                Stairs            Wheelchair Mobility    Modified Rankin (Stroke Patients Only)       Balance Overall balance assessment: Needs assistance Sitting-balance support: No upper extremity supported;Feet supported Sitting balance-Leahy Scale: Zero Sitting balance - Comments: Pt needed total assist to sit EOB with pt unable to attempt assist with sitting at all. Sat 10 min but only opened her eyes but would not track to therapist.  Pt with flexed trunk and neck as well and when PT assisted neck extension pt could not hold head up.  Pertinent Vitals/Pain Pain Assessment: Faces Faces Pain Scale: Hurts whole lot Pain Location: right LE Pain Descriptors / Indicators: Aching;Discomfort;Grimacing;Guarding;Operative site guarding Pain Intervention(s): Limited activity within patient's tolerance;Monitored during session;Premedicated before session;Repositioned    Home Living Family/patient expects to be discharged to:: Private residence Living Arrangements: Spouse/significant other Available Help at Discharge: Family;Available 24 hours/day Type of Home: House Home Access: Stairs to enter Entrance Stairs-Rails: Right;Left;Can reach both Entrance Stairs-Number of Steps: 5 Home Layout: One level Home Equipment:  Walker - 2 wheels;Wheelchair - manual;Shower seat - built in;Grab bars - toilet;Grab bars - tub/shower;Shower seat      Prior Function Level of Independence: Needs assistance   Gait / Transfers Assistance Needed: Pt Modif I with RW or Modif I pedalling wheelchair with feet per husband.    ADL's / Homemaking Assistance Needed: Independent with B/D        Hand Dominance        Extremity/Trunk Assessment   Upper Extremity Assessment Upper Extremity Assessment: Defer to OT evaluation    Lower Extremity Assessment Lower Extremity Assessment: Difficult to assess due to impaired cognition    Cervical / Trunk Assessment Cervical / Trunk Assessment: Kyphotic  Communication   Communication: No difficulties  Cognition Arousal/Alertness: Lethargic Behavior During Therapy: Flat affect Overall Cognitive Status: Impaired/Different from baseline Area of Impairment: Orientation;Attention;Following commands;Safety/judgement;Awareness;Problem solving                 Orientation Level: Disoriented to;Place;Time;Situation     Following Commands:  (inable to follow commands) Safety/Judgement: Decreased awareness of safety;Decreased awareness of deficits   Problem Solving: Slow processing;Decreased initiation;Requires verbal cues;Requires tactile cues General Comments: Did open eyes to commands but did not follow any other commands      General Comments      Exercises     Assessment/Plan    PT Assessment Patient needs continued PT services  PT Problem List Decreased strength;Decreased range of motion;Decreased activity tolerance;Decreased balance;Decreased mobility;Decreased knowledge of use of DME;Decreased safety awareness;Decreased knowledge of precautions;Pain       PT Treatment Interventions DME instruction;Gait training;Functional mobility training;Therapeutic activities;Therapeutic exercise;Balance training;Patient/family education;Wheelchair mobility training    PT  Goals (Current goals can be found in the Care Plan section)  Acute Rehab PT Goals Patient Stated Goal: to go home PT Goal Formulation: With family Time For Goal Achievement: 08/22/17 Potential to Achieve Goals: Good    Frequency Min 5X/week   Barriers to discharge Inaccessible home environment (5 steps to get into home, husband checking to get ramp built)      Co-evaluation PT/OT/SLP Co-Evaluation/Treatment: Yes Reason for Co-Treatment: Complexity of the patient's impairments (multi-system involvement);For patient/therapist safety PT goals addressed during session: Mobility/safety with mobility         AM-PAC PT "6 Clicks" Daily Activity  Outcome Measure Difficulty turning over in bed (including adjusting bedclothes, sheets and blankets)?: Unable Difficulty moving from lying on back to sitting on the side of the bed? : Unable Difficulty sitting down on and standing up from a chair with arms (e.g., wheelchair, bedside commode, etc,.)?: Unable Help needed moving to and from a bed to chair (including a wheelchair)?: Total Help needed walking in hospital room?: Total Help needed climbing 3-5 steps with a railing? : Total 6 Click Score: 6    End of Session Equipment Utilized During Treatment: Gait belt;Oxygen Activity Tolerance: Patient limited by fatigue;Patient limited by pain Patient left: in bed;with call bell/phone within reach;with family/visitor present;with SCD's reapplied Nurse Communication: Mobility status;Need for lift equipment  PT Visit Diagnosis: Muscle weakness (generalized) (M62.81);Pain Pain - Right/Left: Right Pain - part of body: Leg    Time: 1610-9604 PT Time Calculation (min) (ACUTE ONLY): 19 min   Charges:   PT Evaluation $PT Eval Moderate Complexity: 1 Mod     PT G Codes:        Shaylee Stanislawski,PT Acute Rehabilitation [phone] (pager)   Berline Lopes 08/08/2017, 10:28 AM

## 2017-08-08 NOTE — Progress Notes (Signed)
Pt had change in mental status, responds only to painful stimuli, slight right facial droop, bilateral eye brow twitching, left arm tremor, disoriented x 4, unable to follow commands, unable to track or maintain eye contact, pale, lethargic, decrease in blood pressure. Rapid RN called. CBG obtained, ABG preformed, rapid notified triad on call NP Blount who ordered a liter bolus and consulted neuro MD Aroor. MD and NP to bedside to assess pt and accompanied pt to STAT CT. Husband at bedside, updated. Will continue to monitor.

## 2017-08-08 NOTE — Significant Event (Signed)
Rapid Response Event Note  Overview:Called by bedside RN d/t increasing lethargy t/o night.  Pt had ORIF yesterday and was lethargic post-op but it was attributed to anesthesia.  Frequent neuro exams ordered. Time Called: 0515 Arrival Time: 0517 Event Type: Neurologic  Initial Focused Assessment: Pt laying in bed, skin warm and dry.  Pt pale.  Lungs diminished.  HR-106 a- paced.  BP-98/54 however has been SBP-70-80s t/o night, RR-24, SpO2-97% on 3L [place].  Pt will open eyes and moan to sternal rub.  Pt will move extremities to painful stimuli but will not follow commands.  R facial droop and L eye twitching noted. Pupils are pinpoint. Hbg-11.1 pre-op and 8.7 post-op.  Interventions: ABG WNL.  CBG-179. Dr. Bruna Potter notified and ordered 1L NS bolus and serial CBCs.  Dr. Laurence Slate notified and ordered CT-head.  CT-head negative for acute changes.   Plan of Care (if not transferred): 1L NS bolus.  Serial CBCs. possible MRI/MRA if able to go with hardware in leg. Call RRT if further assistance needed. Event Summary: Name of Physician Notified: Bruna Potter, NP at 0530  Name of Consulting Physician Notified: Dr. Laurence Slate at [phone]  Outcome: Stayed in room and stabalized  Event End Time: 0630  Terrilyn Saver

## 2017-08-08 NOTE — Evaluation (Signed)
Occupational Therapy Evaluation Patient Details Name: Sonya Campbell MRN: [id number] DOB: 16-Nov-1945 Today's Date: 08/08/2017    History of Present Illness Sonya Campbell is an 71 y.o. female with HTN, CAD, CKD stage 3, CHF, PCM implant, Neuropathy, Dementia, hyperthyroidism admitted on 9.24.2018 for right distal femur fracture after mechanical fall. She underwent ORIF under general anesthesia. She is lethargic post op which was attributed to GA and was undergoing frequent neurochecks. At 5.15 am Rapid response was called as patient was more lethargic, difficult to arouse with sternal rub and no longer following any commands.there is also questionable right facial droop noted as well as left eye twitching.  Neurology was notified of change in mental status.   Clinical Impression   PTA, pt was independent with assistive devices for ADL and functional mobility. She utilized RW or self-propelled w/c with her feet for mobility. Pt currently requires overall total assist with ADL. She demonstrated significant lethargy, decreased attention, and inability to follow commands impacting her ability to participate this session. She did open her eyes when assisted to sit at EOB with total assistance but did not track. She was able to tolerate activity seated at EOB for approximately q0 minutes with HR 60-64 bpm and SpO2 98% on 3L O2. On arrival, BP 105/63 and 121/47 in supine prior to sitting at EOB. 138/62 once back to supine but unable to obtain reading in sitting position. She would benefit from continued OT services while admitted to improve independence and safety with ADL participation. Recommend SNF level rehabilitation post-acute D/C. Will continue to follow while admitted.    Follow Up Recommendations  SNF;Supervision/Assistance - 24 hour    Equipment Recommendations  Other (comment) (TBD at next venue of care)    Recommendations for Other Services       Precautions / Restrictions  Precautions Precautions: Fall Restrictions Weight Bearing Restrictions: Yes RLE Weight Bearing: Touchdown weight bearing      Mobility Bed Mobility Overal bed mobility: Needs Assistance Bed Mobility: Supine to Sit     Supine to sit: Total assist;+2 for physical assistance     General bed mobility comments: Did not help at all with getting to EOB.  Crying out in pain.   Transfers                 General transfer comment: unable as pt too lethargic    Balance Overall balance assessment: Needs assistance Sitting-balance support: No upper extremity supported;Feet supported Sitting balance-Leahy Scale: Zero Sitting balance - Comments: Pt needed total assist to sit EOB with pt unable to attempt assist with sitting at all. Sat 10 min but only opened her eyes but would not track to therapist.  Pt with flexed trunk and neck as well and when PT assisted neck extension pt could not hold head up.                                     ADL either performed or assessed with clinical judgement   ADL Overall ADL's : Needs assistance/impaired                                       General ADL Comments: Pt currently requiring total assistance for ADL participation.      Vision   Vision Assessment?: Vision impaired- to be further tested  in functional context Additional Comments: Pt able to open eyes when assisted to sit at EOB. Seemed to have slight R gaze preference. Did not appear to make eye contact with therapist or husband.      Perception     Praxis      Pertinent Vitals/Pain Pain Assessment: Faces Faces Pain Scale: Hurts whole lot Pain Location: right LE Pain Descriptors / Indicators: Aching;Discomfort;Grimacing;Guarding;Operative site guarding Pain Intervention(s): Limited activity within patient's tolerance;Monitored during session;Premedicated before session;Repositioned     Hand Dominance     Extremity/Trunk Assessment Upper  Extremity Assessment Upper Extremity Assessment: Generalized weakness;RUE deficits/detail;LUE deficits/detail;Difficult to assess due to impaired cognition RUE Deficits / Details: [place] UE in flexed position but was able to move passively.  LUE Deficits / Details: Holding UE in flexed position but able to passively and actively extend.    Lower Extremity Assessment Lower Extremity Assessment: Difficult to assess due to impaired cognition   Cervical / Trunk Assessment Cervical / Trunk Assessment: Kyphotic   Communication Communication Communication: No difficulties   Cognition Arousal/Alertness: Lethargic Behavior During Therapy: Flat affect Overall Cognitive Status: Impaired/Different from baseline Area of Impairment: Orientation;Attention;Following commands;Safety/judgement;Awareness;Problem solving                 Orientation Level: Disoriented to;Place;Time;Situation Current Attention Level: Focused   Following Commands:  (unable to follow commands) Safety/Judgement: Decreased awareness of safety;Decreased awareness of deficits   Problem Solving: Slow processing;Decreased initiation;Requires verbal cues;Requires tactile cues General Comments: Did open eyes to commands inconsistently when seated at EOB but did not follow any other commands   General Comments       Exercises     Shoulder Instructions      Home Living Family/patient expects to be discharged to:: Private residence Living Arrangements: Spouse/significant other Available Help at Discharge: Family;Available 24 hours/day Type of Home: House Home Access: Stairs to enter [employer] of Steps: 5 Entrance Stairs-Rails: Right;Left;Can reach both Home Layout: One level     Bathroom Shower/Tub: Producer, television/film/video: Standard     Home Equipment: Environmental consultant - 2 wheels;Wheelchair - manual;Shower seat - built in;Grab bars - toilet;Grab bars - tub/shower;Shower seat          Prior  Functioning/Environment Level of Independence: Needs assistance  Gait / Transfers Assistance Needed: Pt Modif I with RW or Modif I pedalling wheelchair with feet per husband.   ADL's / Homemaking Assistance Needed: Independent with bathing and dressing per husband            OT Problem List: Decreased strength;Decreased activity tolerance;Impaired balance (sitting and/or standing);Decreased safety awareness;Decreased knowledge of use of DME or AE;Decreased knowledge of precautions;Pain;Decreased range of motion;Decreased cognition;Impaired vision/perception      OT Treatment/Interventions: Self-care/ADL training;Therapeutic exercise;Energy conservation;DME and/or AE instruction;Therapeutic activities;Patient/family education;Balance training;Cognitive remediation/compensation;Visual/perceptual remediation/compensation    OT Goals(Current goals can be found in the care plan section) Acute Rehab OT Goals Patient Stated Goal: to go home OT Goal Formulation: With family Time For Goal Achievement: 08/22/17 Potential to Achieve Goals: Good ADL Goals Pt Will Perform Grooming: with mod assist;sitting Pt Will Transfer to Toilet: with max assist;bedside commode;stand pivot transfer Additional ADL Goal #1: Pt will complete bed mobility in preparation for ADL seated at EOB with overall moderate assistance.  Additional ADL Goal #2: Pt will follow 2/3 one-step commands during seated ADL tasks.  Additional ADL Goal #3: Pt will demonstrate sustained attention to ADL task in a minimally distracting environment.   OT Frequency: Min 2X/week  Barriers to D/C:            Co-evaluation PT/OT/SLP Co-Evaluation/Treatment: Yes Reason for Co-Treatment: Complexity of the patient's impairments (multi-system involvement);For patient/therapist safety PT goals addressed during session: Mobility/safety with mobility OT goals addressed during session: ADL's and self-care      AM-PAC PT "6 Clicks" Daily  Activity     Outcome Measure Help from another person eating meals?: Total Help from another person taking care of personal grooming?: Total Help from another person toileting, which includes using toliet, bedpan, or urinal?: Total Help from another person bathing (including washing, rinsing, drying)?: Total Help from another person to put on and taking off regular upper body clothing?: Total Help from another person to put on and taking off regular lower body clothing?: Total 6 Click Score: 6   End of Session Equipment Utilized During Treatment: Oxygen Nurse Communication: Mobility status  Activity Tolerance: Patient tolerated treatment well Patient left: in bed;with call bell/phone within reach;with family/visitor present  OT Visit Diagnosis: Muscle weakness (generalized) (M62.81);Other symptoms and signs involving cognitive function                Time: 9518-8416 OT Time Calculation (min): 19 min Charges:  OT General Charges $OT Visit: 1 Visit OT Evaluation $OT Eval Moderate Complexity: 1 Mod G-Codes:     Doristine Section, MS OTR/L  Pager: [phone]   Sonya Campbell 08/08/2017, 12:05 PM

## 2017-08-08 NOTE — Progress Notes (Signed)
Brief Nutrition Follow-Up Note  Chart reviewed.   Pt s/p Procedure(s) (LRB) on 08/07/17: OPEN REDUCTION INTERNAL FIXATION (ORIF) DISTAL FEMUR FRACTURE (Right)  Pt assessed by RD on 08/07/17- refer to progress note for further details. Pt has been advanced to a carb modified diet. Will add Glucerna Shake po TID, each supplement provides 220 kcal and 10 grams of protein.   RD will continue to follow for acute nutrition needs.   Jaymin Waln A. Mayford Knife, RD, LDN, CDE Pager: [phone] After hours Pager: [phone]

## 2017-08-08 NOTE — Progress Notes (Signed)
Called pt's daughter to assess pt's baseline neurological status. Hx of dementia. Pt currently exhibiting delayed responses and unable to answer assessment questions. Sometimes not verbally responsive at all. Pupils 2 and unresponsive bilaterally, no evidence of drift or asymmetric features at this time. MD aware, will begin [facility] neuro checks.

## 2017-08-08 NOTE — Progress Notes (Signed)
EEG completed; results pending.    

## 2017-08-08 NOTE — ED Notes (Signed)
100 mcg total of fentanyl wasted, witnessed by Rondel Oh, RN

## 2017-08-08 NOTE — Progress Notes (Addendum)
PROGRESS NOTE    Sonya Campbell  ZOX:[id number] DOB: 26-Feb-1946 DOA: 08/06/2017 PCP: Donita Brooks, MD   Brief Narrative: 71 year old female with history of hypertension, dyslipidemia, depression, dementia, chronic diastolic congestive heart failure, status post aVR, bovine valve, fibromyalgia, chronic kidney disease stage III, CAD, CABG, hyperparathyroidism presented after a fall sustaining a right thigh pain. Patient with a closed fracture of right distal femur.S/P ORIF on 9/24.   Assessment & Plan:  # Fall with right distal femur fracture: - s/p ORIF on 9/24.  -As per ortho; nonweightbearing to the right leg. No ROM restrictions. She will receive lovenox for VTE prophylaxis. -PT, OT evaluation and possible rehabilitation discharge. -Social worker consult.  # Lethargy likely acute metabolic encephalopathy in the setting of anesthesia/surgery and pain medication: Overnight events noted. Evaluated by neurology. CT scan of head with no acute finding. This morning patient was alert awake with the name but not following commands. Patient has pacemaker therefore unable to obtain MRI. Discussed with Dr. Amada Jupiter, ordered EEG and plan for CT scan of head tomorrow. Neurology consult appreciated. Frequent neurochecks. Discussed with the patient's husband at bedside. Patient has early stage dementia as per patient's husband. -ABG reviewed -Reduced gabapentin to 300 mg bid. Pt has reduced GFR.  # Mild fever likely related with surgical distress. I will order UA and chest x-ray. Continue to monitor.  #Possible operative hypotension: Blood pressure now improved. Holding propranolol, Lasix, Imdur. Monitor blood pressure.  #History of coronary artery disease: On metoprolol, pravastatin. Other cardiac medication on hold because of hypotension  #Type 2 diabetes with hypoglycemia: I will reduce Lantus to 10 units twice a day. Monitor blood sugar level. Order swallow evaluation when medically status  improved.  # Hyperthyroidism: Continue methimazole  #Chronic diastolic congestive heart failure: On metoprolol.  #Chronic kidney disease stage III: Serum creatinine level is stable. Monitor BMP. Avoid nephrotoxins  #Peripheral neuropathy: Lower the dose of Neurontin.  #Acute blood loss anemia likely during surgery: Drop in hemoglobin noted. Monitor CBC. No sign of active bleed.  DVT prophylaxis:lovenox sq as per ortho Code Status: Full code Family Communication: Discussed with the patient's husband at bedside Disposition Plan: Currently admitted    Consultants:   Orthopedics  Neurology  Procedures: ORIF  Antimicrobials:none  Subjective: Seen and examined at bedside. Patient opened her eyes with her name but did not follow commands. Husband at bedside.  Objective: Vitals:   08/08/17 0600 08/08/17 0700 08/08/17 0744 08/08/17 1133  BP: 115/65 100/64 115/61 (!) 134/49  Pulse: (!) 108 (!) 105 (!) 108 61  Resp: (!) 23 (!) 25 (!) 23 19  Temp:   97.7 F (36.5 C) 98.5 F (36.9 C)  TempSrc:   Oral Oral  SpO2: 99% 98% 99% 100%  Weight:      Height:        Intake/Output Summary (Last 24 hours) at 08/08/17 1209 Last data filed at 08/08/17 1100  Gross per 24 hour  Intake           2608.6 ml  Output             1145 ml  Net           1463.6 ml   Filed Weights   08/07/17 1808  Weight: 94.2 kg (207 lb 9.6 oz)    Examination:  General exam: Appears calm and comfortable  Respiratory system: Clear to auscultation. Respiratory effort normal. No wheezing or crackle Cardiovascular system: S1 & S2 heard, RRR.  No pedal  edema. Gastrointestinal system: Abdomen is nondistended, soft and nontender. Normal bowel sounds heard. Central nervous system: Opened his eyes with the name but not following commands.. Skin: No rashes, lesions or ulcers Psychiatry: Judgement and insight unable to assess     Data Reviewed: I have personally reviewed following labs and imaging  studies  CBC:  Recent Labs Lab 08/07/17 0115 08/08/17 0214 08/08/17 0749  WBC 14.9* 15.2* 15.1*  NEUTROABS 12.3*  --   --   HGB 11.1* 8.7* 8.5*  HCT 33.9* 25.9* 25.9*  MCV 93.9 94.9 95.2  PLT 302 244 215   Basic Metabolic Panel:  Recent Labs Lab 08/07/17 0115 08/08/17 0214  NA 136 139  K 4.2 3.9  CL 101 106  CO2 24 24  GLUCOSE 359* 186*  BUN 23* 30*  CREATININE 1.53* 1.65*  CALCIUM 9.2 8.5*   GFR: Estimated Creatinine Clearance: 32.5 mL/min (A) (by C-G formula based on SCr of 1.65 mg/dL (H)). Liver Function Tests: No results for input(s): AST, ALT, ALKPHOS, BILITOT, PROT, ALBUMIN in the last 168 hours. No results for input(s): LIPASE, AMYLASE in the last 168 hours. No results for input(s): AMMONIA in the last 168 hours. Coagulation Profile:  Recent Labs Lab 08/07/17 0115  INR 1.00   Cardiac Enzymes: No results for input(s): CKTOTAL, CKMB, CKMBINDEX, TROPONINI in the last 168 hours. BNP (last 3 results) No results for input(s): PROBNP in the last 8760 hours. HbA1C:  Recent Labs  08/07/17 1231  HGBA1C 8.6*   CBG:  Recent Labs Lab 08/07/17 1920 08/07/17 2049 08/08/17 0521 08/08/17 0748 08/08/17 1137  GLUCAP 108* 123* 173* 197* 175*   Lipid Profile: No results for input(s): CHOL, HDL, LDLCALC, TRIG, CHOLHDL, LDLDIRECT in the last 72 hours. Thyroid Function Tests: No results for input(s): TSH, T4TOTAL, FREET4, T3FREE, THYROIDAB in the last 72 hours. Anemia Panel: No results for input(s): VITAMINB12, FOLATE, FERRITIN, TIBC, IRON, RETICCTPCT in the last 72 hours. Sepsis Labs: No results for input(s): PROCALCITON, LATICACIDVEN in the last 168 hours.  Recent Results (from the past 240 hour(s))  Surgical pcr screen     Status: None   Collection Time: 08/07/17  9:01 AM  Result Value Ref Range Status   MRSA, PCR NEGATIVE NEGATIVE Final   Staphylococcus aureus NEGATIVE NEGATIVE Final    Comment: (NOTE) The Xpert SA Assay (FDA approved for NASAL  specimens in patients 55 years of age and older), is one component of a comprehensive surveillance program. It is not intended to diagnose infection nor to guide or monitor treatment.   Culture, Urine     Status: None   Collection Time: 08/07/17  9:39 AM  Result Value Ref Range Status   Specimen Description URINE, RANDOM  Final   Special Requests NONE  Final   Culture NO GROWTH  Final   Report Status 08/08/2017 FINAL  Final         Radiology Studies: Dg Chest 1 View  Result Date: 08/07/2017 CLINICAL DATA:  Fall in Environmental health practitioner.  Right leg pain. EXAM: CHEST 1 VIEW COMPARISON:  None. FINDINGS: Left-sided pacemaker in place. Post median sternotomy with prosthetic aortic valve. The heart is enlarged. Low lung volumes. No pulmonary edema. Probable basilar atelectasis. No large pleural effusion. No pneumothorax. Surgical tacks in the right proximal humerus from presumed rotator cuff repair. No acute osseous abnormalities are seen. IMPRESSION: Cardiomegaly without evidence of failure. Mild bibasilar atelectasis. Electronically Signed   By: Rubye Oaks M.D.   On: 08/07/2017 00:34  Dg Tibia/fibula Right  Result Date: 08/07/2017 CLINICAL DATA:  Right lower leg pain after fall in kitchen floor tonight. EXAM: RIGHT TIBIA AND FIBULA - 2 VIEW COMPARISON:  None. FINDINGS: Distal femur fracture partially included, assessed on concurrent femur radiographs. The tibia and fibula appear intact without tibia or fibula fracture. 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 The bones are under mineralized. Chronic change in the mid/ hindfoot is partially included. Advanced vascular calcifications and surgical clips in the medial soft tissues. IMPRESSION: No tibia or fibula fracture. Distal femur fracture is partially included. Electronically Signed   By: Rubye Oaks M.D.   On: 08/07/2017 00:32   Ct Head Wo Contrast  Result Date:  08/08/2017 CLINICAL DATA:  Altered mental status and right-sided facial droop EXAM: CT HEAD WITHOUT CONTRAST TECHNIQUE: Contiguous axial images were obtained from the base of the skull through the vertex without intravenous contrast. COMPARISON:  Head CT 08/07/2017 FINDINGS: Brain: No mass lesion, intraparenchymal hemorrhage or extra-axial collection. No evidence of acute cortical infarct. There is periventricular hypoattenuation compatible with chronic microvascular disease. Vascular: No hyperdense vessel or unexpected calcification. Skull: Normal visualized skull base, calvarium and extracranial soft tissues. Sinuses/Orbits: No sinus fluid levels or advanced mucosal thickening. No mastoid effusion. Normal orbits. IMPRESSION: Findings of chronic ischemic microangiopathy without acute intracranial abnormality. Electronically Signed   By: Deatra Robinson M.D.   On: 08/08/2017 06:31   Ct Head Wo Contrast  Result Date: 08/07/2017 CLINICAL DATA:  Patient slipped and fell at home this evening. Multiple falls recently due to neuropathy. EXAM: CT HEAD WITHOUT CONTRAST CT CERVICAL SPINE WITHOUT CONTRAST TECHNIQUE: Multidetector CT imaging of the head and cervical spine was performed following the standard protocol without intravenous contrast. Multiplanar CT image reconstructions of the cervical spine were also generated. COMPARISON:  04/27/2017 FINDINGS: CT HEAD FINDINGS Brain: Chronic mild-to-moderate small vessel ischemic disease of periventricular white matter with mild 16 superficial and central atrophy. No intra-axial mass nor extra-axial fluid collections. Fourth ventricle is midline. Vascular: Moderate atherosclerosis of the carotid siphons. No hyperdense vessels. Skull: Negative for fracture or focal lesion. Sinuses/Orbits: Left maxillary median antrostomy defect. Thickened left maxillary sinus walls with sinus atelectasis likely representing stigmata of chronic left maxillary sinusitis. Bilateral lens  replacements. Intact orbits and globes. No retrobulbar abnormality. Other: Clear bilateral mastoids. Mild frontal scalp soft tissue swelling. CT CERVICAL SPINE FINDINGS Alignment: Normal. Skull base and vertebrae: No acute fracture. No primary bone lesion or focal pathologic process. Soft tissues and spinal canal: No prevertebral fluid or swelling. No visible canal hematoma. Disc levels: Mild central disc bulges C2-3, C3-4 and C4-5. Small disc - osteophyte complexes C5-6 and C6-7. Multilevel degenerative facet arthropathy most prominently affecting the right C3-4 facet. Upper chest: Negative. Other: None IMPRESSION: 1. Mild superficial and central atrophy with chronic stable small vessel ischemic disease of periventricular and subcortical white matter. 2. Mild soft tissue swelling of the forehead. 3. Left maxillary sinus postsurgical change with sinus atelectasis. 4. Lower cervical spondylosis at C5-6 and C6-7 with multilevel degenerative facet arthropathy more severely affecting the right C3-4 facet. 5. No acute cervical spine fracture or posttraumatic subluxation. Electronically Signed   By: Tollie Eth M.D.   On: 08/07/2017 01:00   Ct Cervical Spine Wo Contrast  Result Date: 08/07/2017 CLINICAL DATA:  Patient slipped and fell at home this evening. Multiple falls recently due to neuropathy. EXAM: CT HEAD WITHOUT CONTRAST CT CERVICAL SPINE WITHOUT CONTRAST TECHNIQUE: Multidetector CT imaging of the  head and cervical spine was performed following the standard protocol without intravenous contrast. Multiplanar CT image reconstructions of the cervical spine were also generated. COMPARISON:  04/27/2017 FINDINGS: CT HEAD FINDINGS Brain: Chronic mild-to-moderate small vessel ischemic disease of periventricular white matter with mild 16 superficial and central atrophy. No intra-axial mass nor extra-axial fluid collections. Fourth ventricle is midline. Vascular: Moderate atherosclerosis of the carotid siphons. No  hyperdense vessels. Skull: Negative for fracture or focal lesion. Sinuses/Orbits: Left maxillary median antrostomy defect. Thickened left maxillary sinus walls with sinus atelectasis likely representing stigmata of chronic left maxillary sinusitis. Bilateral lens replacements. Intact orbits and globes. No retrobulbar abnormality. Other: Clear bilateral mastoids. Mild frontal scalp soft tissue swelling. CT CERVICAL SPINE FINDINGS Alignment: Normal. Skull base and vertebrae: No acute fracture. No primary bone lesion or focal pathologic process. Soft tissues and spinal canal: No prevertebral fluid or swelling. No visible canal hematoma. Disc levels: Mild central disc bulges C2-3, C3-4 and C4-5. Small disc - osteophyte complexes C5-6 and C6-7. Multilevel degenerative facet arthropathy most prominently affecting the right C3-4 facet. Upper chest: Negative. Other: None IMPRESSION: 1. Mild superficial and central atrophy with chronic stable small vessel ischemic disease of periventricular and subcortical white matter. 2. Mild soft tissue swelling of the forehead. 3. Left maxillary sinus postsurgical change with sinus atelectasis. 4. Lower cervical spondylosis at C5-6 and C6-7 with multilevel degenerative facet arthropathy more severely affecting the right C3-4 facet. 5. No acute cervical spine fracture or posttraumatic subluxation. Electronically Signed   By: Tollie Eth M.D.   On: 08/07/2017 01:00   Dg C-arm 61-120 Min  Result Date: 08/07/2017 CLINICAL DATA:  Open reduction and internal fixation of right distal femoral fracture. EXAM: RIGHT FEMUR 2 VIEWS; DG C-ARM 61-120 MIN FLUOROSCOPY TIME:  2 minutes 58 seconds. COMPARISON:  Radiographs of August 07, 2017. FINDINGS: Sixteen intraoperative fluoroscopic images of the distal right femur demonstrate the patient to be status post surgical internal fixation of comminuted fracture involving the distal right femur. Improved alignment of fracture components is noted.  IMPRESSION: Status post surgical internal fixation of comminuted fracture involving the distal right femur. Electronically Signed   By: Lupita Raider, M.D.   On: 08/07/2017 15:21   Dg Femur, Min 2 Views Right  Result Date: 08/07/2017 CLINICAL DATA:  Open reduction and internal fixation of right distal femoral fracture. EXAM: RIGHT FEMUR 2 VIEWS; DG C-ARM 61-120 MIN FLUOROSCOPY TIME:  2 minutes 58 seconds. COMPARISON:  Radiographs of August 07, 2017. FINDINGS: Sixteen intraoperative fluoroscopic images of the distal right femur demonstrate the patient to be status post surgical internal fixation of comminuted fracture involving the distal right femur. Improved alignment of fracture components is noted. IMPRESSION: Status post surgical internal fixation of comminuted fracture involving the distal right femur. Electronically Signed   By: Lupita Raider, M.D.   On: 08/07/2017 15:21   Dg Femur Min 2 Views Right  Result Date: 08/07/2017 CLINICAL DATA:  Larey Seat on Environmental health practitioner. EXAM: RIGHT FEMUR 2 VIEWS COMPARISON:  None. FINDINGS: Acute comminuted distal femur metadiaphyseal fracture with lateral angulation distal bony fragments. Osteopenia. Advanced degenerative changes included knee. No dislocation. No destructive bony lesions. Severe vascular calcifications. IMPRESSION: Acute displaced distal femur fracture.  No dislocation. Electronically Signed   By: Awilda Metro M.D.   On: 08/07/2017 00:27   Dg Femur Port, Min 2 Views Right  Result Date: 08/07/2017 CLINICAL DATA:  Open reduction and internal fixation of the distal femur fracture EXAM: RIGHT FEMUR  PORTABLE 2 VIEW COMPARISON:  Fluoroscopy from earlier today. FINDINGS: Lateral plate and screw fixation of a comminuted supracondylar femur fracture. Unchanged rotation of a medial fracture fragment at level of the metaphysis. Hardware is intact and in stable position compared to intraoperative fluoroscopy. Osteopenia and atherosclerosis.  Advanced knee osteoarthritis with suprapatellar joint body. IMPRESSION: Status post ORIF of a supracondylar femur fracture. Stable fracture and hardware alignment compared to fluoroscopy earlier today. Electronically Signed   By: Marnee Spring M.D.   On: 08/07/2017 16:11        Scheduled Meds: . baclofen  10 mg Oral BID  . brimonidine  1 drop Both Eyes Q8H  . dicyclomine  20 mg Oral BID  . doxepin  10 mg Oral QHS  . feeding supplement (GLUCERNA SHAKE)  237 mL Oral TID BM  . fluorometholone  1 drop Both Eyes QID  . FLUoxetine  20 mg Oral QHS  . FLUoxetine  40 mg Oral Daily  . gabapentin  600 mg Oral TID  . Influenza vac split quadrivalent PF  0.5 mL Intramuscular Tomorrow-1000  . insulin aspart  0-9 Units Subcutaneous TID WC  . insulin glargine  15 Units Subcutaneous BID  . latanoprost  1 drop Both Eyes QHS  . Lifitegrast  1 drop Ophthalmic BID  . Loteprednol Etabonate  1 drop Both Eyes Daily  . mouth rinse  15 mL Mouth Rinse BID  . methimazole  5 mg Oral q morning - 10a  . metoprolol tartrate  50 mg Oral BID  . mupirocin ointment  1 application Nasal BID  . pravastatin  80 mg Oral q1800  . pyridoxine  100 mg Oral Daily  . timolol  1 drop Both Eyes BID  . triamcinolone ointment   Topical BID  . valACYclovir  500 mg Oral Daily  . vitamin B-12  1,000 mcg Oral Daily   Continuous Infusions: .  ceFAZolin (ANCEF) IV Stopped (08/08/17 0716)     LOS: 1 day    Dron Jaynie Collins, MD [facility] Pager [phone]  If 7PM-7AM, please contact night-coverage [email or web address] Password [facility] 08/08/2017, 12:09 PM

## 2017-08-08 NOTE — Procedures (Signed)
History: 71 year old female with altered mental status following  Sedation: none  Technique: This is a 21 channel routine scalp EEG performed at the bedside with bipolar and monopolar montages arranged in accordance to the international 10/20 system of electrode placement. One channel was dedicated to EKG recording.    Background: The background consists of generalized irregular delta and theta activities. At times this does come in runs followed by brief, < 1 second periods of attenuation. There is a poorly formed posterior dominant rhythm of 6 Hz that is poorly sustained. There are bifrontally perdominant discharges with triphasic morphology that occur at times throughout the recording.    Photic stimulation: Physiologic driving is not performed.   EEG Abnormalities: 1) Triphasic wave 2) Generalized irregular slow activity.  3) Slow PDR  Clinical Interpretation: This EEG is consistent with a moderate to severe generalized non-specific cerebral dysfunction. There was no seizure or seizure predisposition recorded on this study. Please note that a normal EEG does not preclude the possibility of epilepsy.   Ritta Slot, MD [facility] [phone]  If 7pm- 7am, please page neurology on call as listed in AMION.

## 2017-08-08 NOTE — Consult Note (Addendum)
Requesting Physician: Dr. Ronalee Belts    Chief Complaint:  Lethargy, altered mental status  History obtained from:  Chart   HPI:                                                                                                                                       Sonya Campbell is an 71 y.o. female with HTN, CAD, CKD stage 3, CHF, PCM implant, Neuropathy, Dementia, hyperthyroidism admitted on 9.24.2018 for right distal femur fracture after mechanical fall. She underwent ORIF under general anesthesia. She is lethargic post op which was attributed to GA and was undergoing frequent neurochecks. At 5.15 am Rapid response was called as patient was more lethargic, difficult to arouse with sternal rub and no longer following any commands.there is also questionable right facial droop noted as well as left eye twitching.  Neurology was notified of change in mental status.  ABG was normal and electrolytes within normal limits. A stat CT head was obtained which showed no acute abnormalities   Date last known well: 9.24.18 Time last known well: Around noon prior to surgery  tPA Given:No outside window    Past Medical History:  Diagnosis Date  . CAD (coronary artery disease)   . Chronic diastolic (congestive) heart failure (HCC)   . Chronic kidney disease    stage 3  . Depression   . Diabetes mellitus without complication (HCC)   . Fibromyalgia   . Hyperlipidemia   . Hyperthyroidism   . 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 Neuropathy     Past Surgical History:  Procedure Laterality Date  . 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 CORONARY ARTERY BYPASS GRAFT    . KNEE SURGERY     Left knee    Family History  Problem Relation Age of Onset  . Diabetes Mellitus II Mother   . Dementia Father   . Diabetes Mellitus II Sister   . Cancer Brother        Bladder cancer   Social History:  reports  that she has quit smoking. She has quit using smokeless tobacco. She reports that she does not drink alcohol or use drugs.  Allergies:  Allergies  Allergen Reactions  . Shrimp [Shellfish Allergy] Shortness Of Breath and Swelling    stated to her by allergist in past.   . Beef-Derived Products Other (See Comments)    Unknown, maybe swelling-patient cant recall at this time, stated to her by allergist in past.  . Benadryl [Diphenhydramine] Other (See Comments)    Causes her to fall asleep, also not able to wake her.    Corky Sox [Cefaclor]     Unknown   . Cymbalta [Duloxetine Hcl]   . Levaquin [Levofloxacin] Itching  . Lipitor [Atorvastatin] Itching  . Metformin And Related Nausea  And Vomiting  . Other Itching    Unknown med at this time.   . Pork-Derived Products Other (See Comments)    Unknown, stated to her by allergist from past.     Medications:                                                                                                                           Reviewed  ROS:                                                                                                                                       Unable to obtain    Examination:                                                                                                      General: Appears well-developed and well-nourished.  Psych: Affect appropriate to situation Eyes: No scleral injection HENT: No OP obstrucion Head: Normocephalic.  Cardiovascular: Normal rate and regular rhythm.  Respiratory: Effort normal and breath sounds normal to anterior ascultation GI: Soft.  No distension. There is no tenderness.  Skin: WDI   Neurological Examination Mental Status: Lethargic, opens eyes to painful stimuli, screams to noxious stimuli, states her name, not following commands Cranial Nerves: Pupils 3mm bilaterally, sluggish  Motor: Withdraws equal strength on both UE, unable to assess R LUE due to recent  surgery  Tone and bulk:normal tone throughout; no atrophy noted Sensory: Pinprick and light touch intact throughout, bilaterally Deep Tendon Reflexes: 2+ and symmetric throughout Plantars: Right: downgoing   Left: downgoing Cerebellar: unable to assess       Lab Results: Basic Metabolic Panel:  Recent Labs Lab 08/07/17 0115 08/08/17 0214  NA 136 139  K 4.2 3.9  CL 101 106  CO2 24 24  GLUCOSE 359* 186*  BUN 23* 30*  CREATININE 1.53* 1.65*  CALCIUM 9.2 8.5*    CBC:  Recent Labs Lab 08/07/17 0115 08/08/17 0214  WBC 14.9* 15.2*  NEUTROABS 12.3*  --   HGB 11.1* 8.7*  HCT 33.9* 25.9*  MCV 93.9 94.9  PLT 302 244    Coagulation Studies:  Recent Labs  08/07/17 0115  LABPROT 13.1  INR 1.00    Imaging: Dg Chest 1 View  Result Date: 08/07/2017 CLINICAL DATA:  Fall in Environmental health practitioner.  Right leg pain. EXAM: CHEST 1 VIEW COMPARISON:  None. FINDINGS: Left-sided pacemaker in place. Post median sternotomy with prosthetic aortic valve. The heart is enlarged. Low lung volumes. No pulmonary edema. Probable basilar atelectasis. No large pleural effusion. No pneumothorax. Surgical tacks in the right proximal humerus from presumed rotator cuff repair. No acute osseous abnormalities are seen. IMPRESSION: Cardiomegaly without evidence of failure. Mild bibasilar atelectasis. Electronically Signed   By: Rubye Oaks M.D.   On: 08/07/2017 00:34   Dg Tibia/fibula Right  Result Date: 08/07/2017 CLINICAL DATA:  Right lower leg pain after fall in kitchen floor tonight. EXAM: RIGHT TIBIA AND FIBULA - 2 VIEW COMPARISON:  None. FINDINGS: Distal femur fracture partially included, assessed on concurrent femur radiographs. The tibia and fibula appear intact without tibia or fibula fracture. 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 The bones are under mineralized. Chronic change in the mid/ hindfoot is partially included. Advanced vascular  calcifications and surgical clips in the medial soft tissues. IMPRESSION: No tibia or fibula fracture. Distal femur fracture is partially included. Electronically Signed   By: Rubye Oaks M.D.   On: 08/07/2017 00:32   Ct Head Wo Contrast  Result Date: 08/08/2017 CLINICAL DATA:  Altered mental status and right-sided facial droop EXAM: CT HEAD WITHOUT CONTRAST TECHNIQUE: Contiguous axial images were obtained from the base of the skull through the vertex without intravenous contrast. COMPARISON:  Head CT 08/07/2017 FINDINGS: Brain: No mass lesion, intraparenchymal hemorrhage or extra-axial collection. No evidence of acute cortical infarct. There is periventricular hypoattenuation compatible with chronic microvascular disease. Vascular: No hyperdense vessel or unexpected calcification. Skull: Normal visualized skull base, calvarium and extracranial soft tissues. Sinuses/Orbits: No sinus fluid levels or advanced mucosal thickening. No mastoid effusion. Normal orbits. IMPRESSION: Findings of chronic ischemic microangiopathy without acute intracranial abnormality. Electronically Signed   By: Deatra Robinson M.D.   On: 08/08/2017 06:31   Ct Head Wo Contrast  Result Date: 08/07/2017 CLINICAL DATA:  Patient slipped and fell at home this evening. Multiple falls recently due to neuropathy. EXAM: CT HEAD WITHOUT CONTRAST CT CERVICAL SPINE WITHOUT CONTRAST TECHNIQUE: Multidetector CT imaging of the head and cervical spine was performed following the standard protocol without intravenous contrast. Multiplanar CT image reconstructions of the cervical spine were also generated. COMPARISON:  04/27/2017 FINDINGS: CT HEAD FINDINGS Brain: Chronic mild-to-moderate small vessel ischemic disease of periventricular white matter with mild 16 superficial and central atrophy. No intra-axial mass nor extra-axial fluid collections. Fourth ventricle is midline. Vascular: Moderate atherosclerosis of the carotid siphons. No hyperdense  vessels. Skull: Negative for fracture or focal lesion. Sinuses/Orbits: Left maxillary median antrostomy defect. Thickened left maxillary sinus walls with sinus atelectasis likely representing stigmata of chronic left maxillary sinusitis. Bilateral lens replacements. Intact orbits and globes. No retrobulbar abnormality. Other: Clear bilateral mastoids. Mild frontal scalp soft tissue swelling. CT CERVICAL SPINE FINDINGS Alignment: Normal. Skull base and vertebrae: No acute fracture. No primary bone lesion or focal pathologic process. Soft tissues and spinal canal: No prevertebral fluid or swelling. No visible canal hematoma. Disc levels: Mild central disc bulges C2-3, C3-4 and C4-5. Small  disc - osteophyte complexes C5-6 and C6-7. Multilevel degenerative facet arthropathy most prominently affecting the right C3-4 facet. Upper chest: Negative. Other: None IMPRESSION: 1. Mild superficial and central atrophy with chronic stable small vessel ischemic disease of periventricular and subcortical white matter. 2. Mild soft tissue swelling of the forehead. 3. Left maxillary sinus postsurgical change with sinus atelectasis. 4. Lower cervical spondylosis at C5-6 and C6-7 with multilevel degenerative facet arthropathy more severely affecting the right C3-4 facet. 5. No acute cervical spine fracture or posttraumatic subluxation. Electronically Signed   By: Tollie Eth M.D.   On: 08/07/2017 01:00   Ct Cervical Spine Wo Contrast  Result Date: 08/07/2017 CLINICAL DATA:  Patient slipped and fell at home this evening. Multiple falls recently due to neuropathy. EXAM: CT HEAD WITHOUT CONTRAST CT CERVICAL SPINE WITHOUT CONTRAST TECHNIQUE: Multidetector CT imaging of the head and cervical spine was performed following the standard protocol without intravenous contrast. Multiplanar CT image reconstructions of the cervical spine were also generated. COMPARISON:  04/27/2017 FINDINGS: CT HEAD FINDINGS Brain: Chronic mild-to-moderate small  vessel ischemic disease of periventricular white matter with mild 16 superficial and central atrophy. No intra-axial mass nor extra-axial fluid collections. Fourth ventricle is midline. Vascular: Moderate atherosclerosis of the carotid siphons. No hyperdense vessels. Skull: Negative for fracture or focal lesion. Sinuses/Orbits: Left maxillary median antrostomy defect. Thickened left maxillary sinus walls with sinus atelectasis likely representing stigmata of chronic left maxillary sinusitis. Bilateral lens replacements. Intact orbits and globes. No retrobulbar abnormality. Other: Clear bilateral mastoids. Mild frontal scalp soft tissue swelling. CT CERVICAL SPINE FINDINGS Alignment: Normal. Skull base and vertebrae: No acute fracture. No primary bone lesion or focal pathologic process. Soft tissues and spinal canal: No prevertebral fluid or swelling. No visible canal hematoma. Disc levels: Mild central disc bulges C2-3, C3-4 and C4-5. Small disc - osteophyte complexes C5-6 and C6-7. Multilevel degenerative facet arthropathy most prominently affecting the right C3-4 facet. Upper chest: Negative. Other: None IMPRESSION: 1. Mild superficial and central atrophy with chronic stable small vessel ischemic disease of periventricular and subcortical white matter. 2. Mild soft tissue swelling of the forehead. 3. Left maxillary sinus postsurgical change with sinus atelectasis. 4. Lower cervical spondylosis at C5-6 and C6-7 with multilevel degenerative facet arthropathy more severely affecting the right C3-4 facet. 5. No acute cervical spine fracture or posttraumatic subluxation. Electronically Signed   By: Tollie Eth M.D.   On: 08/07/2017 01:00   Dg C-arm 61-120 Min  Result Date: 08/07/2017 CLINICAL DATA:  Open reduction and internal fixation of right distal femoral fracture. EXAM: RIGHT FEMUR 2 VIEWS; DG C-ARM 61-120 MIN FLUOROSCOPY TIME:  2 minutes 58 seconds. COMPARISON:  Radiographs of August 07, 2017. FINDINGS:  Sixteen intraoperative fluoroscopic images of the distal right femur demonstrate the patient to be status post surgical internal fixation of comminuted fracture involving the distal right femur. Improved alignment of fracture components is noted. IMPRESSION: Status post surgical internal fixation of comminuted fracture involving the distal right femur. Electronically Signed   By: Lupita Raider, M.D.   On: 08/07/2017 15:21   Dg Femur, Min 2 Views Right  Result Date: 08/07/2017 CLINICAL DATA:  Open reduction and internal fixation of right distal femoral fracture. EXAM: RIGHT FEMUR 2 VIEWS; DG C-ARM 61-120 MIN FLUOROSCOPY TIME:  2 minutes 58 seconds. COMPARISON:  Radiographs of August 07, 2017. FINDINGS: Sixteen intraoperative fluoroscopic images of the distal right femur demonstrate the patient to be status post surgical internal fixation of comminuted fracture involving the  distal right femur. Improved alignment of fracture components is noted. IMPRESSION: Status post surgical internal fixation of comminuted fracture involving the distal right femur. Electronically Signed   By: Lupita Raider, M.D.   On: 08/07/2017 15:21   Dg Femur Min 2 Views Right  Result Date: 08/07/2017 CLINICAL DATA:  Larey Seat on Environmental health practitioner. EXAM: RIGHT FEMUR 2 VIEWS COMPARISON:  None. FINDINGS: Acute comminuted distal femur metadiaphyseal fracture with lateral angulation distal bony fragments. Osteopenia. Advanced degenerative changes included knee. No dislocation. No destructive bony lesions. Severe vascular calcifications. IMPRESSION: Acute displaced distal femur fracture.  No dislocation. Electronically Signed   By: Awilda Metro M.D.   On: 08/07/2017 00:27   Dg Femur Port, Min 2 Views Right  Result Date: 08/07/2017 CLINICAL DATA:  Open reduction and internal fixation of the distal femur fracture EXAM: RIGHT FEMUR PORTABLE 2 VIEW COMPARISON:  Fluoroscopy from earlier today. FINDINGS: Lateral plate and screw  fixation of a comminuted supracondylar femur fracture. Unchanged rotation of a medial fracture fragment at level of the metaphysis. Hardware is intact and in stable position compared to intraoperative fluoroscopy. Osteopenia and atherosclerosis. Advanced knee osteoarthritis with suprapatellar joint body. IMPRESSION: Status post ORIF of a supracondylar femur fracture. Stable fracture and hardware alignment compared to fluoroscopy earlier today. Electronically Signed   By: Marnee Spring M.D.   On: 08/07/2017 16:11     ASSESSMENT AND PLAN  71 y.o. female with HTN, CAD, CKD stage 3, CHF, Neuropathy, Dementia, hyperthyroidism  admitted on 9.24.2018 for right distal femur fracture presents with lethargy post op. Metabolic workup mostly unrevealing except drop in hemoglobin.   Differentials  Ischemic stroke - not a candidate for IVtPA due to unclear last known normal, recent surgery. Unlikely to have LVO.  Anesthesia/pain medication in patient with dementia [place] also be a possibility. Fat emboli Bojanowski also be in the differential.   Recommend stat MRI brain ( please check if  PCM  MRI compatible) Continue neurochecks   Nikky Duba [facility] Pager Number [phone]

## 2017-08-09 ENCOUNTER — Inpatient Hospital Stay (HOSPITAL_COMMUNITY): Payer: Medicare Other

## 2017-08-09 LAB — BASIC METABOLIC PANEL
Anion gap: 9 (ref 5–15)
BUN: 33 mg/dL — AB (ref 6–20)
CALCIUM: 8.6 mg/dL — AB (ref 8.9–10.3)
CO2: 25 mmol/L (ref 22–32)
CREATININE: 1.71 mg/dL — AB (ref 0.44–1.00)
Chloride: 107 mmol/L (ref 101–111)
GFR calc Af Amer: 33 mL/min — ABNORMAL LOW (ref >60)
GFR, EST NON AFRICAN AMERICAN: 28 mL/min — AB (ref >60)
Glucose, Bld: 194 mg/dL — ABNORMAL HIGH (ref 65–99)
Potassium: 3.7 mmol/L (ref 3.5–5.1)
SODIUM: 141 mmol/L (ref 135–145)

## 2017-08-09 LAB — POCT I-STAT 4, (NA,K, GLUC, HGB,HCT)
GLUCOSE: 316 mg/dL — AB (ref 65–99)
Glucose, Bld: 248 mg/dL — ABNORMAL HIGH (ref 65–99)
HCT: 27 % — ABNORMAL LOW (ref 36.0–46.0)
HEMATOCRIT: 29 % — AB (ref 36.0–46.0)
HEMOGLOBIN: 9.2 g/dL — AB (ref 12.0–15.0)
Hemoglobin: 9.9 g/dL — ABNORMAL LOW (ref 12.0–15.0)
POTASSIUM: 3.6 mmol/L (ref 3.5–5.1)
Potassium: 3.7 mmol/L (ref 3.5–5.1)
SODIUM: 143 mmol/L (ref 135–145)
Sodium: 145 mmol/L (ref 135–145)

## 2017-08-09 LAB — GLUCOSE, CAPILLARY
GLUCOSE-CAPILLARY: 156 mg/dL — AB (ref 65–99)
GLUCOSE-CAPILLARY: 217 mg/dL — AB (ref 65–99)
Glucose-Capillary: 240 mg/dL — ABNORMAL HIGH (ref 65–99)
Glucose-Capillary: 197 mg/dL — ABNORMAL HIGH (ref 65–99)

## 2017-08-09 LAB — CBC
HCT: 26.1 % — ABNORMAL LOW (ref 36.0–46.0)
Hemoglobin: 8.6 g/dL — ABNORMAL LOW (ref 12.0–15.0)
MCH: 31.5 pg (ref 26.0–34.0)
MCHC: 33 g/dL (ref 30.0–36.0)
MCV: 95.6 fL (ref 78.0–100.0)
PLATELETS: 225 10*3/uL (ref 150–400)
RBC: 2.73 MIL/uL — ABNORMAL LOW (ref 3.87–5.11)
RDW: 13.2 % (ref 11.5–15.5)
WBC: 13.7 10*3/uL — ABNORMAL HIGH (ref 4.0–10.5)

## 2017-08-09 MED ORDER — ISOSORBIDE MONONITRATE ER 60 MG PO TB24
60.0000 mg | ORAL_TABLET | Freq: Every day | ORAL | Status: DC
  Administered 2017-08-09 – 2017-08-13 (×5): 60 mg via ORAL
  Filled 2017-08-09 (×5): qty 1

## 2017-08-09 MED ORDER — FUROSEMIDE 20 MG PO TABS
20.0000 mg | ORAL_TABLET | Freq: Every day | ORAL | Status: DC
  Administered 2017-08-09 – 2017-08-13 (×5): 20 mg via ORAL
  Filled 2017-08-09 (×5): qty 1

## 2017-08-09 NOTE — Progress Notes (Signed)
Pharmacy: Re:  Both Metoprolol and Propranolol on home med list   Per outpatient pharmacy records, Propranolol last filled in 10/2015. Has been on Metoprolol since 2017.  Husband confirms not taking Propranolol at home. Removed Propranolol for prior to admission med list. Informed Dr. Ronalee Belts.  Dennie Fetters, [place] Pager: [phone] 08/09/2017 5:24 PM
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
## 2017-08-09 NOTE — Progress Notes (Signed)
Orthopaedic Trauma Progress Note  S: Doing better today. Currently awake and following commands. Sister, daughter and husband at bedside  O:  Vitals:   08/09/17 0600 08/09/17 0800  BP: (!) 174/63 (!) 171/57  Pulse: 69 70  Resp: 17 12  Temp:  98.4 F (36.9 C)  SpO2: 99% 99%  General-Awake but not oriented RLE: ACE wrap in place, painful to move leg at all. Some serosang striketrough the ACE wrap. Intact EHL/FHL, GSC/TA. Sensation intact to light touch  Labs: Hgb 8.6  A/P: 71 yo female POD2 s/p ORIF supracondylar distal femur  -NWB RLE -Knee ROM as tolerated -Take down ACE wrap POD3 -PT/OT when able -Appreciate internal medicine and neurology assisting and medical co-management  Roby Lofts, MD [facility] [phone] (phone)

## 2017-08-09 NOTE — Progress Notes (Signed)
Physical Therapy Treatment Patient Details Name: Sonya Campbell MRN: [id number] DOB: [birth year] Today's Date: 08/09/2017    History of Present Illness Sonya Campbell is an 71 y.o. female with HTN, CAD, CKD stage 3, CHF, PCM implant, Neuropathy, Dementia, hyperthyroidism admitted on 9.24.2018 for right distal femur fracture after mechanical fall. She underwent ORIF under general anesthesia. She is lethargic post op which was attributed to GA and was undergoing frequent neurochecks. At 5.15 am Rapid response was called as patient was more lethargic, difficult to arouse with sternal rub and no longer following any commands.there is also questionable right facial droop noted as well as left eye twitching.  Neurology was notified of change in mental status.    PT Comments    Pt admitted with above diagnosis. Pt currently with functional limitations due to balance and endurance deficits as well as strength deficits. Pt was able to perform bed level exercises as nurse did not want pt up due to testing later this am.  Pt more alert and following commands today with incr time.  Pain is less in the right LE as well. Family present and attentive.  Pt will benefit from skilled PT to increase their independence and safety with mobility to allow discharge to the venue listed below.     Follow Up Recommendations  SNF;Supervision/Assistance - 24 hour     Equipment Recommendations  None recommended by PT    Recommendations for Other Services       Precautions / Restrictions Precautions Precautions: Fall Restrictions Weight Bearing Restrictions: Yes RLE Weight Bearing: Touchdown weight bearing    Mobility  Bed Mobility               General bed mobility comments: Nurse did not want pt up as she has a test this am.  Transfers                    Ambulation/Gait                 Stairs            Wheelchair Mobility    Modified Rankin (Stroke Patients Only)        Balance                                            Cognition Arousal/Alertness: Awake/alert Behavior During Therapy: Flat affect Overall Cognitive Status: Impaired/Different from baseline Area of Impairment: Orientation;Attention;Following commands;Safety/judgement;Awareness;Problem solving                 Orientation Level: Disoriented to;Time;Situation Current Attention Level: Focused   Following Commands: Follows one step commands with increased time Safety/Judgement: Decreased awareness of safety;Decreased awareness of deficits   Problem Solving: Slow processing;Decreased initiation;Requires verbal cues;Requires tactile cues General Comments: Pt much more alert and responsive today. Delayed response time of up to 4 seconds and needed repetition.       Exercises General Exercises - Upper Extremity Shoulder Flexion: AAROM;Both;10 reps;Supine Shoulder ABduction: AAROM;Both;10 reps;Supine Shoulder Horizontal ABduction: AAROM;Both;10 reps;Supine Shoulder Horizontal ADduction: AAROM;Both;10 reps;Supine Elbow Flexion: AAROM;Both;5 reps;Supine Elbow Extension: AAROM;Both;5 reps;Supine Wrist Flexion: AAROM;Both;10 reps;Supine Wrist Extension: AAROM;Both;10 reps;Supine General Exercises - Lower Extremity Ankle Circles/Pumps: AAROM;Both;5 reps;Supine Quad Sets: AROM;Both;5 reps;Supine Heel Slides: AAROM;Both;10 reps;Supine Hip ABduction/ADduction: AAROM;Both;10 reps;Supine    General Comments        Pertinent Vitals/Pain Pain Assessment: Faces Faces  Pain Scale: Hurts little more Pain Location: right LE Pain Descriptors / Indicators: Aching;Discomfort;Grimacing;Guarding;Operative site guarding Pain Intervention(s): Limited activity within patient's tolerance;Monitored during session;Repositioned  66 bpm, 100% 3LO2, 177/59  Home Living                      Prior Function            PT Goals (current goals can now be found in the care  plan section) Acute Rehab PT Goals Patient Stated Goal: to go home Progress towards PT goals: Progressing toward goals    Frequency    Min 5X/week      PT Plan Current plan remains appropriate    Co-evaluation              AM-PAC PT "6 Clicks" Daily Activity  Outcome Measure  Difficulty turning over in bed (including adjusting bedclothes, sheets and blankets)?: Unable Difficulty moving from lying on back to sitting on the side of the bed? : Unable Difficulty sitting down on and standing up from a chair with arms (e.g., wheelchair, bedside commode, etc,.)?: Unable Help needed moving to and from a bed to chair (including a wheelchair)?: Total Help needed walking in hospital room?: Total Help needed climbing 3-5 steps with a railing? : Total 6 Click Score: 6    End of Session Equipment Utilized During Treatment: Gait belt;Oxygen Activity Tolerance: Patient limited by fatigue Patient left: in bed;with call bell/phone within reach;with family/visitor present;with SCD's reapplied Nurse Communication: Mobility status;Need for lift equipment PT Visit Diagnosis: Muscle weakness (generalized) (M62.81);Pain Pain - Right/Left: Right Pain - part of body: Leg     Time: 1040-1107 PT Time Calculation (min) (ACUTE ONLY): 27 min  Charges:  $Therapeutic Exercise: 23-37 mins                    G Codes:       Sonya Campbell,PT Acute Rehabilitation [phone] (pager)    Sonya Campbell 08/09/2017, 1:46 PM

## 2017-08-09 NOTE — Progress Notes (Signed)
PROGRESS NOTE    Sonya Campbell  ZOX:[id number] DOB: 02-Mar-1946 DOA: 08/06/2017 PCP: Donita Brooks, MD   Brief Narrative: 71 year old female with history of hypertension, dyslipidemia, depression, dementia, chronic diastolic congestive heart failure, status post aVR, bovine valve, fibromyalgia, chronic kidney disease stage III, CAD, CABG, hyperparathyroidism presented after a fall sustaining a right thigh pain. Patient with a closed fracture of right distal femur.S/P ORIF on 9/24.   Assessment & Plan:  # Fall with right distal femur fracture: - s/p ORIF on 9/24.  -As per ortho; nonweightbearing to the right leg. No ROM restrictions. Lovenox for VTE prophylaxis. -PT, OT evaluation and possible rehabilitation on discharge. -Social worker consulted.  # Lethargy, acute metabolic encephalopathy in the setting of anesthesia/surgery and pain medication: -Patient is more alert, awake today compared to yesterday. Discussed with the neurologist. Continue to hold sedatives. Plan for repeat CT scan today. EEG with no seizure activity. PT, OT and social worker evaluation for safe discharge planning. Swallow evaluation. Neurology consult appreciated. Frequent neurochecks.  Discussed with the patient's sister and husband at bedside. Patient has early stage dementia as per patient's husband. -Reduced gabapentin to 300 mg bid. Pt has reduced GFR.  # Mild fever likely related with surgical stress. UA and chest x-ray with no sign of infection.  #Hypertension. Blood pressure elevated. Resume Lasix, Imdur and metoprolol. Patient is also on propranolol at home, pharmacy was consulted to continue home medication. Monitor blood pressure.  #History of coronary artery disease: On metoprolol, pravastatin.   #Type 2 diabetes with hypoglycemia: Continue lower dose of Lantus. Patient has decreased oral intake. Monitor blood sugar level. Swallow evaluation.   # Hyperthyroidism: Continue methimazole. Propranolol on  hold since patient is already on metoprolol.  #Chronic diastolic congestive heart failure: On metoprolol.  #Chronic kidney disease stage III: Serum creatinine level is stable. Monitor BMP. Avoid nephrotoxins  #Peripheral neuropathy: Lower the dose of Neurontin.  #Acute blood loss anemia likely during surgery: Hemoglobin is stable. No sign of active bleed.  DVT prophylaxis:lovenox sq as per ortho Code Status: Full code Family Communication: Discussed with the patient's husband and sister at bedside Disposition Plan: Currently admitted    Consultants:   Orthopedics  Neurology  Procedures: ORIF  Antimicrobials:none  Subjective: Seen and examined at bedside. Patient was more alert awake today. Review of systems Limited. Patient's aspirin and sister at bedside. Objective: Vitals:   08/09/17 0500 08/09/17 0600 08/09/17 0800 08/09/17 1100  BP: (!) 156/57 (!) 174/63 (!) 171/57   Pulse: 69 69 70   Resp: Temp:   98.4 F (36.9 C) 99.6 F (37.6 C)  TempSrc:    Oral  SpO2: 98% 99% 99%   Weight:      Height:        Intake/Output Summary (Last 24 hours) at 08/09/17 1236 Last data filed at 08/09/17 1154  Gross per 24 hour  Intake                0 ml  Output              800 ml  Net             -800 ml   Filed Weights   08/07/17 1808  Weight: 94.2 kg (207 lb 9.6 oz)    Examination:  General exam: Not in distress  Respiratory system: Clear bilateral, respiratory effort normal. No wheezing or crackle Cardiovascular system: Regular rate rhythm S1-S2 normal. No pedal edema. Gastrointestinal  system: Abdomen soft, nontender nondistended. Bowel sound positive Central nervous system: Alert awake and responsive to name. Skin: No rashes, lesions or ulcers Psychiatry: Judgement and insight unable to assess     Data Reviewed: I have personally reviewed following labs and imaging studies  CBC:  Recent Labs Lab 08/07/17 0115 08/08/17 0214 08/08/17 0749  08/09/17 0313  WBC 14.9* 15.2* 15.1* 13.7*  NEUTROABS 12.3*  --   --   --   HGB 11.1* 8.7* 8.5* 8.6*  HCT 33.9* 25.9* 25.9* 26.1*  MCV 93.9 94.9 95.2 95.6  PLT 302 244 215 225   Basic Metabolic Panel:  Recent Labs Lab 08/07/17 0115 08/08/17 0214 08/09/17 0313  NA 136 139 141  K 4.2 3.9 3.7  CL 101 106 107  CO2 GLUCOSE 359* 186* 194*  BUN 23* 30* 33*  CREATININE 1.53* 1.65* 1.71*  CALCIUM 9.2 8.5* 8.6*   GFR: Estimated Creatinine Clearance: 31.3 mL/min (A) (by C-G formula based on SCr of 1.71 mg/dL (H)). Liver Function Tests: No results for input(s): AST, ALT, ALKPHOS, BILITOT, PROT, ALBUMIN in the last 168 hours. No results for input(s): LIPASE, AMYLASE in the last 168 hours. No results for input(s): AMMONIA in the last 168 hours. Coagulation Profile:  Recent Labs Lab 08/07/17 0115  INR 1.00   Cardiac Enzymes: No results for input(s): CKTOTAL, CKMB, CKMBINDEX, TROPONINI in the last 168 hours. BNP (last 3 results) No results for input(s): PROBNP in the last 8760 hours. HbA1C:  Recent Labs  08/07/17 1231  HGBA1C 8.6*   CBG:  Recent Labs Lab 08/08/17 0748 08/08/17 1137 08/08/17 1729 08/09/17 0904 08/09/17 1149  GLUCAP 197* 175* 177* 240* 197*   Lipid Profile: No results for input(s): CHOL, HDL, LDLCALC, TRIG, CHOLHDL, LDLDIRECT in the last 72 hours. Thyroid Function Tests: No results for input(s): TSH, T4TOTAL, FREET4, T3FREE, THYROIDAB in the last 72 hours. Anemia Panel: No results for input(s): VITAMINB12, FOLATE, FERRITIN, TIBC, IRON, RETICCTPCT in the last 72 hours. Sepsis Labs: No results for input(s): PROCALCITON, LATICACIDVEN in the last 168 hours.  Recent Results (from the past 240 hour(s))  Surgical pcr screen     Status: None   Collection Time: 08/07/17  9:01 AM  Result Value Ref Range Status   MRSA, PCR NEGATIVE NEGATIVE Final   Staphylococcus aureus NEGATIVE NEGATIVE Final    Comment: (NOTE) The Xpert SA Assay (FDA  approved for NASAL specimens in patients 67 years of age and older), is one component of a comprehensive surveillance program. It is not intended to diagnose infection nor to guide or monitor treatment.   Culture, Urine     Status: None   Collection Time: 08/07/17  9:39 AM  Result Value Ref Range Status   Specimen Description URINE, RANDOM  Final   Special Requests NONE  Final   Culture NO GROWTH  Final   Report Status 08/08/2017 FINAL  Final         Radiology Studies: Ct Head Wo Contrast  Result Date: 08/08/2017 CLINICAL DATA:  Altered mental status and right-sided facial droop EXAM: CT HEAD WITHOUT CONTRAST TECHNIQUE: Contiguous axial images were obtained from the base of the skull through the vertex without intravenous contrast. COMPARISON:  Head CT 08/07/2017 FINDINGS: Brain: No mass lesion, intraparenchymal hemorrhage or extra-axial collection. No evidence of acute cortical infarct. There is periventricular hypoattenuation compatible with chronic microvascular disease. Vascular: No hyperdense vessel or unexpected calcification. Skull: Normal visualized skull base, calvarium and extracranial soft tissues. Sinuses/Orbits:  No sinus fluid levels or advanced mucosal thickening. No mastoid effusion. Normal orbits. IMPRESSION: Findings of chronic ischemic microangiopathy without acute intracranial abnormality. Electronically Signed   By: Deatra Robinson M.D.   On: 08/08/2017 06:31   Dg Chest Port 1 View  Result Date: 08/08/2017 CLINICAL DATA:  Low-grade fever.  Shortness of breath. EXAM: PORTABLE CHEST 1 VIEW COMPARISON:  08/07/2017 . FINDINGS: Cardiac pacer with lead tips in right atrium right ventricle. Prior CABG. Prior cardiac valve replacement. Cardiomegaly with pulmonary vascular prominence and interstitial prominence with small left pleural effusion. Findings consistent with CHF. Surgical clips right upper quadrant. Postsurgical changes right shoulder . IMPRESSION: Cardiac pacer noted in  stable position. Prior cardiac replacement. Prior CABG. Cardiomegaly with mild pulmonary vascular prominence interstitial prominence with small left pleural effusion. Findings consist with mild CHF . Electronically Signed   By: Maisie Fus  Register   On: 08/08/2017 13:22   Dg C-arm 61-120 Min  Result Date: 08/07/2017 CLINICAL DATA:  Open reduction and internal fixation of right distal femoral fracture. EXAM: RIGHT FEMUR 2 VIEWS; DG C-ARM 61-120 MIN FLUOROSCOPY TIME:  2 minutes 58 seconds. COMPARISON:  Radiographs of August 07, 2017. FINDINGS: Sixteen intraoperative fluoroscopic images of the distal right femur demonstrate the patient to be status post surgical internal fixation of comminuted fracture involving the distal right femur. Improved alignment of fracture components is noted. IMPRESSION: Status post surgical internal fixation of comminuted fracture involving the distal right femur. Electronically Signed   By: Lupita Raider, M.D.   On: 08/07/2017 15:21   Dg Femur, Min 2 Views Right  Result Date: 08/07/2017 CLINICAL DATA:  Open reduction and internal fixation of right distal femoral fracture. EXAM: RIGHT FEMUR 2 VIEWS; DG C-ARM 61-120 MIN FLUOROSCOPY TIME:  2 minutes 58 seconds. COMPARISON:  Radiographs of August 07, 2017. FINDINGS: Sixteen intraoperative fluoroscopic images of the distal right femur demonstrate the patient to be status post surgical internal fixation of comminuted fracture involving the distal right femur. Improved alignment of fracture components is noted. IMPRESSION: Status post surgical internal fixation of comminuted fracture involving the distal right femur. Electronically Signed   By: Lupita Raider, M.D.   On: 08/07/2017 15:21   Dg Femur Port, Min 2 Views Right  Result Date: 08/07/2017 CLINICAL DATA:  Open reduction and internal fixation of the distal femur fracture EXAM: RIGHT FEMUR PORTABLE 2 VIEW COMPARISON:  Fluoroscopy from earlier today. FINDINGS: Lateral plate  and screw fixation of a comminuted supracondylar femur fracture. Unchanged rotation of a medial fracture fragment at level of the metaphysis. Hardware is intact and in stable position compared to intraoperative fluoroscopy. Osteopenia and atherosclerosis. Advanced knee osteoarthritis with suprapatellar joint body. IMPRESSION: Status post ORIF of a supracondylar femur fracture. Stable fracture and hardware alignment compared to fluoroscopy earlier today. Electronically Signed   By: Marnee Spring M.D.   On: 08/07/2017 16:11        Scheduled Meds: . baclofen  10 mg Oral BID  . brimonidine  1 drop Both Eyes Q8H  . dicyclomine  20 mg Oral BID  . doxepin  10 mg Oral QHS  . enoxaparin (LOVENOX) injection  30 mg Subcutaneous Q24H  . feeding supplement (GLUCERNA SHAKE)  237 mL Oral TID BM  . fluorometholone  1 drop Both Eyes QID  . FLUoxetine  20 mg Oral QHS  . FLUoxetine  40 mg Oral Daily  . furosemide  20 mg Oral Daily  . gabapentin  300 mg Oral BID  .  Influenza vac split quadrivalent PF  0.5 mL Intramuscular Tomorrow-1000  . insulin aspart  0-9 Units Subcutaneous TID WC  . insulin glargine  10 Units Subcutaneous BID  . isosorbide mononitrate  60 mg Oral Daily  . latanoprost  1 drop Both Eyes QHS  . mouth rinse  15 mL Mouth Rinse BID  . methimazole  5 mg Oral q morning - 10a  . metoprolol tartrate  50 mg Oral BID  . mupirocin ointment  1 application Nasal BID  . pravastatin  80 mg Oral q1800  . pyridoxine  100 mg Oral Daily  . timolol  1 drop Both Eyes BID  . triamcinolone ointment   Topical BID  . valACYclovir  500 mg Oral Daily  . vitamin B-12  1,000 mcg Oral Daily   Continuous Infusions:    LOS: 2 days    Montay Vanvoorhis Jaynie Collins, MD [facility] Pager [phone]  If 7PM-7AM, please contact night-coverage [email or web address] Password [facility] 08/09/2017, 12:36 PM

## 2017-08-09 NOTE — Progress Notes (Signed)
Subjective: Slightly more awake today.   Exam: Vitals:   08/09/17 0500 08/09/17 0600  BP: (!) 156/57 (!) 174/63  Pulse: 69 69  Resp: 14 17  Temp:    SpO2: 98% 99%   Gen: In bed, NAD Resp: non-labored breathing, no acute distress Abd: soft, nt  Neuro: MS: Awakens to voice, follows commands to wiggle toes and close eyes. Does not answer questions.  CN: PERRL, does not reliably blink to threat from either side, but does fixate and track.  Motor: moves all extremities to nox sitm.  Sensory:as above.   Pertinent Labs: BMP - elevated creatinine  Impression: 71 yo F with AMS following surgery in the setting of dementia. I suspect that this represents a hypoactive post-operative delirium, but would like to repeat head CT today. Also, she is on multiple sedating medications, though not reciveing them. Would favor limiting these.   Recommendations: 1) CT head  2) Will follow.   Ritta Slot, MD [facility] [phone]  If 7pm- 7am, please page neurology on call as listed in AMION.

## 2017-08-10 DIAGNOSIS — E876 Hypokalemia: Secondary | ICD-10-CM

## 2017-08-10 DIAGNOSIS — R410 Disorientation, unspecified: Secondary | ICD-10-CM

## 2017-08-10 DIAGNOSIS — Z794 Long term (current) use of insulin: Secondary | ICD-10-CM

## 2017-08-10 DIAGNOSIS — E1122 Type 2 diabetes mellitus with diabetic chronic kidney disease: Secondary | ICD-10-CM

## 2017-08-10 LAB — BASIC METABOLIC PANEL
Anion gap: 9 (ref 5–15)
BUN: 38 mg/dL — AB (ref 6–20)
CHLORIDE: 106 mmol/L (ref 101–111)
CO2: 25 mmol/L (ref 22–32)
CREATININE: 1.71 mg/dL — AB (ref 0.44–1.00)
Calcium: 8.5 mg/dL — ABNORMAL LOW (ref 8.9–10.3)
GFR calc Af Amer: 33 mL/min — ABNORMAL LOW (ref >60)
GFR calc non Af Amer: 28 mL/min — ABNORMAL LOW (ref >60)
Glucose, Bld: 210 mg/dL — ABNORMAL HIGH (ref 65–99)
Potassium: 3.3 mmol/L — ABNORMAL LOW (ref 3.5–5.1)
Sodium: 140 mmol/L (ref 135–145)

## 2017-08-10 LAB — CBC
HEMATOCRIT: 23.3 % — AB (ref 36.0–46.0)
HEMOGLOBIN: 7.7 g/dL — AB (ref 12.0–15.0)
MCH: 31.8 pg (ref 26.0–34.0)
MCHC: 33 g/dL (ref 30.0–36.0)
MCV: 96.3 fL (ref 78.0–100.0)
Platelets: 240 10*3/uL (ref 150–400)
RBC: 2.42 MIL/uL — ABNORMAL LOW (ref 3.87–5.11)
RDW: 13.2 % (ref 11.5–15.5)
WBC: 12 10*3/uL — ABNORMAL HIGH (ref 4.0–10.5)

## 2017-08-10 LAB — GLUCOSE, CAPILLARY
GLUCOSE-CAPILLARY: 230 mg/dL — AB (ref 65–99)
GLUCOSE-CAPILLARY: 317 mg/dL — AB (ref 65–99)
Glucose-Capillary: 242 mg/dL — ABNORMAL HIGH (ref 65–99)
Glucose-Capillary: 175 mg/dL — ABNORMAL HIGH (ref 65–99)

## 2017-08-10 MED ORDER — POLYETHYLENE GLYCOL 3350 17 G PO PACK
17.0000 g | PACK | Freq: Every day | ORAL | Status: DC
Start: 2017-08-10 — End: 2017-08-13
  Administered 2017-08-10 – 2017-08-13 (×3): 17 g via ORAL
  Filled 2017-08-10 (×4): qty 1

## 2017-08-10 MED ORDER — POTASSIUM CHLORIDE 10 MEQ/100ML IV SOLN
10.0000 meq | INTRAVENOUS | Status: AC
  Administered 2017-08-10 (×3): 10 meq via INTRAVENOUS
  Filled 2017-08-10 (×3): qty 100

## 2017-08-10 MED ORDER — INSULIN GLARGINE 100 UNIT/ML ~~LOC~~ SOLN
15.0000 [IU] | Freq: Two times a day (BID) | SUBCUTANEOUS | Status: DC
  Administered 2017-08-10 – 2017-08-11 (×2): 15 [IU] via SUBCUTANEOUS
  Filled 2017-08-10 (×3): qty 0.15

## 2017-08-10 NOTE — Progress Notes (Signed)
Patient has home CPAP unit. Patient wearing at this time sleeping comfortably. RT will continue to monitor.

## 2017-08-10 NOTE — NC FL2 (Signed)
[place] MEDICAID FL2 LEVEL OF CARE SCREENING TOOL     IDENTIFICATION  Patient Name: Sonya Campbell Birthdate: 12/12/1945 Sex: female Admission Date (Current Location): 08/06/2017  [facility] and [place] Number:  Producer, television/film/video and Address:  [facility], [address], [place], [place] [address]      Provider Number: [id number]  Attending Physician Name and Address:  Maxie Barb, MD  Relative Name and Phone Number:       Current Level of Care: Hospital Recommended Level of Care: Skilled Nursing Facility Prior Approval Number:    Date Approved/Denied:   PASRR Number: [id number]  Discharge Plan: SNF    Current Diagnoses: Patient Active Problem List   Diagnosis Date Noted  . Hypokalemia   . Diabetes mellitus without complication (HCC)   . Acute metabolic encephalopathy   . Peripheral neuropathy 08/07/2017  . CKD (chronic kidney disease), stage III 08/07/2017  . Fall 08/07/2017  . Closed fracture of right distal femur (HCC) 08/07/2017  . Leukocytosis 08/07/2017  . Chronic diastolic CHF (congestive heart failure) (HCC) 08/07/2017  . Hyperthyroidism   . Hyperlipidemia   . Type II diabetes mellitus with renal manifestations (HCC)   . Depression   . CAD (coronary artery disease)     Orientation RESPIRATION BLADDER Height & Weight     Self, Place  O2 (Nasal Canula 2 L) Continent, Indwelling catheter Weight: 207 lb 9.6 oz (94.2 kg) Height:   (157.5 cm)  BEHAVIORAL SYMPTOMS/MOOD NEUROLOGICAL BOWEL NUTRITION STATUS   (None)  (None) Continent Diet (Heart healthy/carb modified)  AMBULATORY STATUS COMMUNICATION OF NEEDS Skin   Total Care Verbally Surgical wounds                       Personal Care Assistance Level of Assistance  Total care       Total Care Assistance: Maximum assistance   Functional Limitations Info  Sight, Hearing, Speech Sight Info: Adequate Hearing Info: Adequate Speech Info: Adequate     SPECIAL CARE FACTORS FREQUENCY  PT (By licensed PT), OT (By licensed OT)     PT Frequency: 5 x week OT Frequency: 5 x week            Contractures Contractures Info: Not present    Additional Factors Info  Code Status, Allergies, Psychotropic Code Status Info: Full Allergies Info: Shrimp (Shellfish Allergy), Beef-derived Products, Benadryl (Diphenhydramine), Ceclor (Cefaclor), Cymbalta (Duloxetine Hcl), Levaquin (Levofloxacin), Lipitor (Atorvastatin), Metformin And Related, Other, Pork-derived Products Psychotropic Info: Depression: Prozac 20 mg PO QHS, Prozac 40 mg PO daily, Hydroxyzine 10 mg PO TID prn.         Current Medications (08/10/2017):  This is the current hospital active medication list Current Facility-Administered Medications  Medication Dose Route Frequency Provider Last Rate Last Dose  . acetaminophen (TYLENOL) suppository 650 mg  650 mg Rectal Q4H PRN Blount, Andi Devon T, NP      . acetaminophen (TYLENOL) tablet 500 mg  500 mg Oral Q6H PRN Lorretta Harp, MD   500 mg at 08/10/17 1155  . baclofen (LIORESAL) tablet 10 mg  10 mg Oral BID Lorretta Harp, MD   10 mg at 08/10/17 5621  . brimonidine (ALPHAGAN) 0.15 % ophthalmic solution 1 drop  1 drop Both Eyes Q8H Lorretta Harp, MD   1 drop at 08/10/17 0601  . dicyclomine (BENTYL) tablet 20 mg  20 mg Oral BID Lorretta Harp, MD   20 mg at 08/10/17 3086  .  doxepin (SINEQUAN) capsule 10 mg  10 mg Oral QHS Lorretta Harp, MD   10 mg at 08/07/17 2338  . enoxaparin (LOVENOX) injection 30 mg  30 mg Subcutaneous Q24H Maxie Barb, MD   30 mg at 08/09/17 1342  . feeding supplement (GLUCERNA SHAKE) (GLUCERNA SHAKE) liquid 237 mL  237 mL Oral TID BM Maxie Barb, MD   237 mL at 08/10/17 0926  . fluorometholone (FML) 0.1 % ophthalmic suspension 1 drop  1 drop Both Eyes QID Lorretta Harp, MD   1 drop at 08/10/17 0927  . FLUoxetine (PROZAC) capsule 20 mg  20 mg Oral QHS Lorretta Harp, MD   20 mg at 08/07/17 2337  . FLUoxetine (PROZAC)  capsule 40 mg  40 mg Oral Daily Lorretta Harp, MD   40 mg at 08/10/17 4098  . furosemide (LASIX) tablet 20 mg  20 mg Oral Daily Maxie Barb, MD   20 mg at 08/10/17 1191  . gabapentin (NEURONTIN) capsule 300 mg  300 mg Oral BID Maxie Barb, MD   300 mg at 08/10/17 4782  . hydrALAZINE (APRESOLINE) injection 5 mg  5 mg Intravenous Q2H PRN Lorretta Harp, MD   5 mg at 08/09/17 0327  . hydrOXYzine (ATARAX/VISTARIL) tablet 10 mg  10 mg Oral TID PRN Lorretta Harp, MD      . Influenza vac split quadrivalent PF (FLUZONE HIGH-DOSE) injection 0.5 mL  0.5 mL Intramuscular Tomorrow-1000 Maxie Barb, MD      . insulin aspart (novoLOG) injection 0-9 Units  0-9 Units Subcutaneous TID WC Lorretta Harp, MD   2 Units at 08/09/17 1726  . insulin glargine (LANTUS) injection 15 Units  15 Units Subcutaneous BID Maxie Barb, MD      . isosorbide mononitrate (IMDUR) 24 hr tablet 60 mg  60 mg Oral Daily Maxie Barb, MD   60 mg at 08/10/17 9562  . latanoprost (XALATAN) 0.005 % ophthalmic solution 1 drop  1 drop Both Eyes QHS Lorretta Harp, MD   1 drop at 08/08/17 2146  . MEDLINE mouth rinse  15 mL Mouth Rinse BID Maxie Barb, MD   15 mL at 08/10/17 1000  . methimazole (TAPAZOLE) tablet 5 mg  5 mg Oral q morning - 10a Lorretta Harp, MD   5 mg at 08/10/17 1308  . methocarbamol (ROBAXIN) tablet 500 mg  500 mg Oral Q8H PRN Lorretta Harp, MD   500 mg at 08/07/17 0340  . metoprolol tartrate (LOPRESSOR) tablet 50 mg  50 mg Oral BID Lorretta Harp, MD   50 mg at 08/10/17 6578  . morphine 4 MG/ML injection 2 mg  2 mg Intravenous Q4H PRN Lorretta Harp, MD   1 mg at 08/09/17 1213  . nitroGLYCERIN (NITROSTAT) SL tablet 0.4 mg  0.4 mg Sublingual Q5 min PRN Lorretta Harp, MD      . oxyCODONE-acetaminophen (PERCOCET/ROXICET) 5-325 MG per tablet 1 tablet  1 tablet Oral Q4H PRN Lorretta Harp, MD   1 tablet at 08/07/17 0340  . polyethylene glycol (MIRALAX / GLYCOLAX) packet 17 g  17 g Oral Daily Maxie Barb,  MD      . pravastatin (PRAVACHOL) tablet 80 mg  80 mg Oral q1800 Lorretta Harp, MD   80 mg at 08/09/17 1637  . pyridOXINE (VITAMIN B-6) tablet 100 mg  100 mg Oral Daily Lorretta Harp, MD   100 mg at 08/10/17 4696  . senna-docusate (Senokot-S) tablet 1 tablet  1 tablet Oral QHS PRN  Lorretta Harp, MD      . timolol (TIMOPTIC) 0.5 % ophthalmic solution 1 drop  1 drop Both Eyes BID Lorretta Harp, MD   1 drop at 08/10/17 0924  . triamcinolone ointment (KENALOG) 0.1 %   Topical BID Lorretta Harp, MD      . valACYclovir (VALTREX) tablet 500 mg  500 mg Oral Daily Lorretta Harp, MD   500 mg at 08/10/17 3086  . vitamin B-12 (CYANOCOBALAMIN) tablet 1,000 mcg  1,000 mcg Oral Daily Lorretta Harp, MD   1,000 mcg at 08/10/17 5784  . zolpidem (AMBIEN) tablet 5 mg  5 mg Oral QHS PRN Lorretta Harp, MD         Discharge Medications: Please see discharge summary for a list of discharge medications.  Relevant Imaging Results:  Relevant Lab Results:   Additional Information SS#: [id number]  Margarito Liner, LCSW

## 2017-08-10 NOTE — Progress Notes (Signed)
Subjective: Continues to improve, though waxing/waning some.   Exam: Vitals:   08/10/17 0600 08/10/17 0800  BP: (!) 152/66 (!) 179/84  Pulse: (!) 59   Resp: (!) 21 20  Temp:  99 F (37.2 C)  SpO2: 100%    Gen: In bed, NAD Resp: non-labored breathing, no acute distress Abd: soft, nt  Neuro: MS: Awakens to voice, follows commands and answers simple questions, does not know the month.  CN: PERRL, blinks to threat bilaterally, EOMI Motor: she follows commands in all four extremities.  Sensory:as above.   Pertinent Labs: BMP - elevated creatinine  Impression: 71 yo F with AMS following surgery in the setting of dementia. I suspect that this represents a hypoactive post-operative delirium. It is gradually improving. I suspect she will gradually improve over time.   Recommendations: 1) Continue supportive care per internal medicein.  2) Please Call with any further questions or concerns.  Ritta Slot, MD [facility] [phone]  If 7pm- 7am, please page neurology on call as listed in AMION.

## 2017-08-10 NOTE — Clinical Social Work Placement (Signed)
   CLINICAL SOCIAL WORK PLACEMENT  NOTE  Date:  08/10/2017  Patient Details  Name: Sonya Campbell MRN: [id number] Date of Birth: 1945-12-15  Clinical Social Work is seeking post-discharge placement for this patient at the Skilled  Nursing Facility level of care (*CSW will initial, date and re-position this form in  chart as items are completed):  Yes   Patient/family provided with [facility] Clinical Social Work Department's list of facilities offering this level of care within the geographic area requested by the patient (or if unable, by the patient's family).  Yes   Patient/family informed of their freedom to choose among providers that offer the needed level of care, that participate in Medicare, Medicaid or managed care program needed by the patient, have an available bed and are willing to accept the patient.  Yes   Patient/family informed of [facility]'s ownership interest in [facility] and [facility], as well as of the fact that they are under no obligation to receive care at these facilities.  PASRR submitted to EDS on 08/10/17     PASRR number received on       Existing PASRR number confirmed on 08/10/17     FL2 transmitted to all facilities in geographic area requested by pt/family on 08/10/17     FL2 transmitted to all facilities within larger geographic area on       Patient informed that his/her managed care company has contracts with or will negotiate with certain facilities, including the following:            Patient/family informed of bed offers received.  Patient chooses bed at       Physician recommends and patient chooses bed at      Patient to be transferred to   on  .  Patient to be transferred to facility by       Patient family notified on   of transfer.  Name of family member notified:        PHYSICIAN Please sign FL2     Additional Comment:    _______________________________________________ Margarito Liner, LCSW 08/10/2017, 1:51  PM

## 2017-08-10 NOTE — Progress Notes (Signed)
Inpatient Diabetes Program Recommendations  AACE/ADA: New Consensus Statement on Inpatient Glycemic Control (2015)  Target Ranges:  Prepandial:   less than 140 mg/dL      Peak postprandial:   less than 180 mg/dL (1-2 hours)      Critically ill patients:  140 - 180 mg/dL   Lab Results  Component Value Date   GLUCAP 230 (H) 08/10/2017   HGBA1C 8.6 (H) 08/07/2017    Review of Glycemic Control Results for Sonya Campbell, Sonya Campbell (MRN [id number]) as of 08/10/2017 11:07  Ref. Range 08/09/2017 09:04 08/09/2017 11:49 08/09/2017 17:15 08/09/2017 21:14 08/10/2017 08:22  Glucose-Capillary Latest Ref Range: 65 - 99 mg/dL 413 (H) 244 (H) 010 (H) 217 (H) 230 (H)   Diabetes history: Type 2 DM-Last A1C=9.8% on 05/19/17 Outpatient Diabetes medications: Lantus 15 units bid, Humulin R 2-6 units tid with meals Current orders for Inpatient glycemic control:  Novolog sensitive tid with meals, Lantus 10 units bid  Inpatient Diabetes Program Recommendations:    Please consider increasing Novolog correction to q 4 hours while NPO.    Thank you, Billy Fischer. Chaynce Schafer, RN, MSN, CDE  Diabetes Coordinator Inpatient Glycemic Control Team Team Pager [phone] (8am-5pm) 08/10/2017 11:10 AM

## 2017-08-10 NOTE — Progress Notes (Addendum)
PROGRESS NOTE    Sonya Campbell  JYN:[id number] DOB: 08-04-1946 DOA: 08/06/2017 PCP: Donita Brooks, MD   Brief Narrative: 71 year old female with history of hypertension, dyslipidemia, depression, dementia, chronic diastolic congestive heart failure, status post aVR, bovine valve, fibromyalgia, chronic kidney disease stage III, CAD, CABG, hyperparathyroidism presented after a fall sustaining a right thigh pain. Patient with a closed fracture of right distal femur.S/P ORIF on 9/24.   Assessment & Plan:  # Fall with right distal femur fracture: - s/p ORIF on 9/24.  -As per ortho; nonweightbearing to the right leg. No ROM restrictions. Lovenox for VTE prophylaxis. -PT, OT evaluation and possible rehabilitation on discharge. -Social worker consulted.  # Lethargy, acute metabolic encephalopathy in the setting of anesthesia/surgery and pain medication: -Patient is alert awake and talking today. Evaluated by swallow team and currently on diet. Encourage oral intake. Repeat CT scan with no acute finding. Neurology consult appreciated. EEG with no seizure-like activity. Continue PT OT and social worker evaluation for safe discharge plan. Minimize sedatives. Continue supportive care. -Discussed with the patient's sister and husband at bedside. Patient has early stage dementia as per patient's husband. -Reduced gabapentin to 300 mg bid. Pt has reduced GFR. -I discussed with patient's daughter over the phone at length. Also discussed with the patient has been at bedside.  # Mild fever likely related with surgical stress. UA and chest x-ray with no sign of infection.  #Hypertension. Blood pressure elevated. Continue Lasix, Imdur and metoprolol. Monitor blood pressure. Pain management.  #History of coronary artery disease: On metoprolol, pravastatin.   #Type 2 diabetes with hyperglycemia: Increased the dose of Lantus to 15 units twice a day. Continue sliding scale. Monitor blood sugar level.   #  Hyperthyroidism: Continue methimazole. Patient is not on propranolol at home, as per pharmacy.Marland Kitchen  #Chronic diastolic congestive heart failure: On metoprolol.  #Chronic kidney disease stage III: Serum creatinine level is stable. Monitor BMP. Avoid nephrotoxins  #Hypokalemia: Replete potassium. Monitor labs.  #Peripheral neuropathy: Lower the dose of Neurontin.  #Acute blood loss anemia likely during surgery: drop in hemoglobin noted today. No sign of active bleeding. Repeat CBC in the morning.  DVT prophylaxis:lovenox sq as per ortho Code Status: Full code Family Communication: Discussed with the patient's husband at bedside and discussed with patient daughter over the phone at length. Disposition Plan: Currently admitted    Consultants:   Orthopedics  Neurology  Procedures: ORIF  Antimicrobials:none  Subjective: Seen and examined at bedside. Patient is alert awake and talking today. Denied headache, dizziness, nausea vomiting chest pain. Has discomfort/pain at the site of surgery. Patient's husband at bedside asking me to call his daughter to give update.  Objective: Vitals:   08/10/17 0400 08/10/17 0500 08/10/17 0600 08/10/17 0800  BP: (!) 158/67 (!) 177/60 (!) 152/66 (!) 179/84  Pulse: 61 (!) 59 (!) 59   Resp: 12 19 (!) 21 20  Temp:    99 F (37.2 C)  TempSrc:    Oral  SpO2: 99% 100% 100%   Weight:      Height:        Intake/Output Summary (Last 24 hours) at 08/10/17 1137 Last data filed at 08/10/17 0937  Gross per 24 hour  Intake              600 ml  Output              775 ml  Net             -  175 ml   Filed Weights   08/07/17 1808  Weight: 94.2 kg (207 lb 9.6 oz)    Examination:  General exam: Not in distress, lying in bed comfortable Respiratory system: Clear bilaterally, respiratory effort normal. No wheezing or crackle Cardiovascular system: Regular rate rhythm, S1-S2 normal. No pedal edema. Gastrointestinal system: Abdomen soft, nontender. Bowel  sound positive Central nervous system: Alert awake and responsive to name. Skin: No rashes, lesions or ulcers Psychiatry: Alert awake and following simple commands.    Data Reviewed: I have personally reviewed following labs and imaging studies  CBC:  Recent Labs Lab 08/07/17 0115  08/07/17 1508 08/08/17 0214 08/08/17 0749 08/09/17 0313 08/10/17 0320  WBC 14.9*  --   --  15.2* 15.1* 13.7* 12.0*  NEUTROABS 12.3*  --   --   --   --   --   --   HGB 11.1*  < > 9.2* 8.7* 8.5* 8.6* 7.7*  HCT 33.9*  < > 27.0* 25.9* 25.9* 26.1* 23.3*  MCV 93.9  --   --  94.9 95.2 95.6 96.3  PLT 302  --   --  244 215 225 240  < > = values in this interval not displayed. Basic Metabolic Panel:  Recent Labs Lab 08/07/17 0115 08/07/17 1410 08/07/17 1508 08/08/17 0214 08/09/17 0313 08/10/17 0320  NA 136 143 145 139 141 140  K 4.2 3.6 3.7 3.9 3.7 3.3*  CL 101  --   --  106 107 106  CO2 24  --   --  GLUCOSE 359* 316* 248* 186* 194* 210*  BUN 23*  --   --  30* 33* 38*  CREATININE 1.53*  --   --  1.65* 1.71* 1.71*  CALCIUM 9.2  --   --  8.5* 8.6* 8.5*   GFR: Estimated Creatinine Clearance: 31.3 mL/min (A) (by C-G formula based on SCr of 1.71 mg/dL (H)). Liver Function Tests: No results for input(s): AST, ALT, ALKPHOS, BILITOT, PROT, ALBUMIN in the last 168 hours. No results for input(s): LIPASE, AMYLASE in the last 168 hours. No results for input(s): AMMONIA in the last 168 hours. Coagulation Profile:  Recent Labs Lab 08/07/17 0115  INR 1.00   Cardiac Enzymes: No results for input(s): CKTOTAL, CKMB, CKMBINDEX, TROPONINI in the last 168 hours. BNP (last 3 results) No results for input(s): PROBNP in the last 8760 hours. HbA1C:  Recent Labs  08/07/17 1231  HGBA1C 8.6*   CBG:  Recent Labs Lab 08/09/17 0904 08/09/17 1149 08/09/17 1715 08/09/17 2114 08/10/17 0822  GLUCAP 240* 197* 156* 217* 230*   Lipid Profile: No results for input(s): CHOL, HDL, LDLCALC, TRIG,  CHOLHDL, LDLDIRECT in the last 72 hours. Thyroid Function Tests: No results for input(s): TSH, T4TOTAL, FREET4, T3FREE, THYROIDAB in the last 72 hours. Anemia Panel: No results for input(s): VITAMINB12, FOLATE, FERRITIN, TIBC, IRON, RETICCTPCT in the last 72 hours. Sepsis Labs: No results for input(s): PROCALCITON, LATICACIDVEN in the last 168 hours.  Recent Results (from the past 240 hour(s))  Surgical pcr screen     Status: None   Collection Time: 08/07/17  9:01 AM  Result Value Ref Range Status   MRSA, PCR NEGATIVE NEGATIVE Final   Staphylococcus aureus NEGATIVE NEGATIVE Final    Comment: (NOTE) The Xpert SA Assay (FDA approved for NASAL specimens in patients 20 years of age and older), is one component of a comprehensive surveillance program. It is not intended to diagnose infection nor to guide or  monitor treatment.   Culture, Urine     Status: None   Collection Time: 08/07/17  9:39 AM  Result Value Ref Range Status   Specimen Description URINE, RANDOM  Final   Special Requests NONE  Final   Culture NO GROWTH  Final   Report Status 08/08/2017 FINAL  Final         Radiology Studies: Ct Head Wo Contrast  Result Date: 08/09/2017 CLINICAL DATA:  Altered level of consciousness. EXAM: CT HEAD WITHOUT CONTRAST TECHNIQUE: Contiguous axial images were obtained from the base of the skull through the vertex without intravenous contrast. COMPARISON:  CT 08/08/2017 FINDINGS: Brain: Ventricle size normal. Mild atrophy. Mild white matter hypodensity bilaterally unchanged. Negative for acute infarct. Negative for hemorrhage or mass. No interval change. Vascular: Negative for hyperdense vessel Skull: Negative Sinuses/Orbits: Prior medial antrostomy left maxillary sinus. Marked bony thickening and traction left maxillary sinus with associated mucosal edema unchanged from yesterday. Remaining sinuses clear. Bilateral cataract removal. Other: None IMPRESSION: No acute abnormality no change from  yesterday. Atrophy and mild chronic microvascular ischemic change in the white matter. Electronically Signed   By: Marlan Palau M.D.   On: 08/09/2017 13:03   Dg Chest Port 1 View  Result Date: 08/08/2017 CLINICAL DATA:  Low-grade fever.  Shortness of breath. EXAM: PORTABLE CHEST 1 VIEW COMPARISON:  08/07/2017 . FINDINGS: Cardiac pacer with lead tips in right atrium right ventricle. Prior CABG. Prior cardiac valve replacement. Cardiomegaly with pulmonary vascular prominence and interstitial prominence with small left pleural effusion. Findings consistent with CHF. Surgical clips right upper quadrant. Postsurgical changes right shoulder . IMPRESSION: Cardiac pacer noted in stable position. Prior cardiac replacement. Prior CABG. Cardiomegaly with mild pulmonary vascular prominence interstitial prominence with small left pleural effusion. Findings consist with mild CHF . Electronically Signed   By: Maisie Fus  Register   On: 08/08/2017 13:22        Scheduled Meds: . baclofen  10 mg Oral BID  . brimonidine  1 drop Both Eyes Q8H  . dicyclomine  20 mg Oral BID  . doxepin  10 mg Oral QHS  . enoxaparin (LOVENOX) injection  30 mg Subcutaneous Q24H  . feeding supplement (GLUCERNA SHAKE)  237 mL Oral TID BM  . fluorometholone  1 drop Both Eyes QID  . FLUoxetine  20 mg Oral QHS  . FLUoxetine  40 mg Oral Daily  . furosemide  20 mg Oral Daily  . gabapentin  300 mg Oral BID  . Influenza vac split quadrivalent PF  0.5 mL Intramuscular Tomorrow-1000  . insulin aspart  0-9 Units Subcutaneous TID WC  . insulin glargine  10 Units Subcutaneous BID  . isosorbide mononitrate  60 mg Oral Daily  . latanoprost  1 drop Both Eyes QHS  . mouth rinse  15 mL Mouth Rinse BID  . methimazole  5 mg Oral q morning - 10a  . metoprolol tartrate  50 mg Oral BID  . pravastatin  80 mg Oral q1800  . pyridoxine  100 mg Oral Daily  . timolol  1 drop Both Eyes BID  . triamcinolone ointment   Topical BID  . valACYclovir  500 mg  Oral Daily  . vitamin B-12  1,000 mcg Oral Daily   Continuous Infusions: . potassium chloride 10 mEq (08/10/17 1127)     LOS: 3 days    Charnetta Wulff Jaynie Collins, MD [facility] Pager [phone]  If 7PM-7AM, please contact night-coverage [email or web address] Password [facility] 08/10/2017, 11:37 AM

## 2017-08-10 NOTE — Progress Notes (Signed)
Physical Therapy Treatment Patient Details Name: Sonya Campbell MRN: [id number] DOB: 06/04/1946 Today's Date: 08/10/2017    History of Present Illness Sonya Campbell is an 71 y.o. female with HTN, CAD, CKD stage 3, CHF, PCM implant, Neuropathy, Dementia, hyperthyroidism admitted on 9.24.2018 for right distal femur fracture after mechanical fall. She underwent ORIF under general anesthesia. She is lethargic post op which was attributed to GA and was undergoing frequent neurochecks. At 5.15 am Rapid response was called as patient was more lethargic, difficult to arouse with sternal rub and no longer following any commands.there is also questionable right facial droop noted as well as left eye twitching.  Neurology was notified of change in mental status.    PT Comments    Pt admitted with above diagnosis. Pt currently with functional limitations due to the deficits listed below (see PT Problem List). Pt continues to progress although slowly.  Pt sitting on EOB with less assist and transferred to chair to day with max assist of 2.  Will continue acute PT.  Pt will benefit from skilled PT to increase their independence and safety with mobility to allow discharge to the venue listed below.     Follow Up Recommendations  SNF;Supervision/Assistance - 24 hour     Equipment Recommendations  None recommended by PT    Recommendations for Other Services       Precautions / Restrictions Precautions Precautions: Fall Restrictions Weight Bearing Restrictions: Yes RLE Weight Bearing: Touchdown weight bearing    Mobility  Bed Mobility Overal bed mobility: Needs Assistance Bed Mobility: Supine to Sit     Supine to sit: +2 for physical assistance;Mod assist;Max assist     General bed mobility comments: Pt initiated movement and needed assist with LES and elevation of trunk.   Transfers Overall transfer level: Needs assistance   Transfers: Lateral/Scoot Transfers          Lateral/Scoot  Transfers: Max assist;+2 physical assistance;From elevated surface General transfer comment: unable to stand.  Scoot pivot to drop arm recliner using pad to  assist. Had to maintain TDWB right LE as well.    Ambulation/Gait                 Stairs            Wheelchair Mobility    Modified Rankin (Stroke Patients Only)       Balance Overall balance assessment: Needs assistance Sitting-balance support: Feet supported;Bilateral upper extremity supported Sitting balance-Leahy Scale: Poor Sitting balance - Comments: Pt needed min assist to sit EOB. Sat 10 min and can perform some reaching.                                      Cognition Arousal/Alertness: Awake/alert Behavior During Therapy: Flat affect Overall Cognitive Status: Impaired/Different from baseline Area of Impairment: Orientation;Attention;Following commands;Safety/judgement;Awareness;Problem solving                 Orientation Level: Disoriented to;Time;Situation Current Attention Level: Focused   Following Commands: Follows one step commands with increased time Safety/Judgement: Decreased awareness of safety;Decreased awareness of deficits   Problem Solving: Slow processing;Decreased initiation;Requires verbal cues;Requires tactile cues General Comments: Pt much more alert and responsive today. Delayed response time of up to 4 seconds and needed repetition.       Exercises General Exercises - Lower Extremity Ankle Circles/Pumps: AAROM;Both;5 reps;Supine Quad Sets: AROM;Both;5 reps;Supine Long Arc Quad:  AROM;Both;5 reps;Seated    General Comments General comments (skin integrity, edema, etc.): Notified nursing of redness and swelling right UE.      Pertinent Vitals/Pain Pain Assessment: Faces Faces Pain Scale: Hurts little more Pain Location: right LE Pain Descriptors / Indicators: Aching;Discomfort;Grimacing;Guarding;Operative site guarding Pain Intervention(s): Limited  activity within patient's tolerance;Monitored during session;Repositioned   VSS Home Living                      Prior Function            PT Goals (current goals can now be found in the care plan section) Progress towards PT goals: Progressing toward goals    Frequency    Min 5X/week      PT Plan Current plan remains appropriate    Co-evaluation              AM-PAC PT "6 Clicks" Daily Activity  Outcome Measure  Difficulty turning over in bed (including adjusting bedclothes, sheets and blankets)?: Unable Difficulty moving from lying on back to sitting on the side of the bed? : Unable Difficulty sitting down on and standing up from a chair with arms (e.g., wheelchair, bedside commode, etc,.)?: Unable Help needed moving to and from a bed to chair (including a wheelchair)?: Total Help needed walking in hospital room?: Total Help needed climbing 3-5 steps with a railing? : Total 6 Click Score: 6    End of Session Equipment Utilized During Treatment: Gait belt;Oxygen Activity Tolerance: Patient limited by fatigue Patient left: with call bell/phone within reach;with family/visitor present;in chair;with chair alarm set Nurse Communication: Mobility status;Need for lift equipment PT Visit Diagnosis: Muscle weakness (generalized) (M62.81);Pain Pain - Right/Left: Right Pain - part of body: Leg     Time: 1610-9604 PT Time Calculation (min) (ACUTE ONLY): 20 min  Charges:  $Therapeutic Activity: 8-22 mins                    G Codes:       Sonya Campbell,PT Acute Rehabilitation [phone] (pager)    Berline Lopes 08/10/2017, 2:42 PM

## 2017-08-10 NOTE — Progress Notes (Signed)
Orthopaedic Trauma Progress Note  S: Awake and conversive this AM. States her leg is sore  O:  Vitals:   08/10/17 0600 08/10/17 0800  BP: (!) 152/66 (!) 179/84  Pulse: (!) 59   Resp: (!) 21 20  Temp:  99 F (37.2 C)  SpO2: 100%   General-Awake but not oriented RLE: ACE wrap taken down. Incisions clean, dry and intact. Intact EHL/FHL, GSC/TA. Sensation intact to light touch. Unable to bend knee more than 20 deg due to pain  A/P: 71 yo female POD3 s/p ORIF supracondylar distal femur  -NWB RLE -Knee ROM as tolerated -PT/OT -Appreciate internal medicine and neurology assisting and medical co-management  Roby Lofts, MD [facility] [phone] (phone)

## 2017-08-10 NOTE — Progress Notes (Signed)
  Patient was previously awake and alert, now lethargic and only opens eyes to moderate stimulation and falls back to sleep, all VSS, Md paged to make aware. Will monitor.   Of note - pts husband states that patient wears CPap at home every night and has not wore one since she has been admitted here, Dr. Ronalee Belts made aware and placed order for patient to wear Cpap, pts husband brought home cpap and will pace on patient (okayed use of home bipap with Dr.  Ronalee Belts). Will continue to monitor.

## 2017-08-10 NOTE — Clinical Social Work Note (Signed)
Clinical Social Work Assessment  Patient Details  Name: Sonya Campbell MRN: [id number] Date of Birth: 09-08-1946  Date of referral:  08/10/17               Reason for consult:  Facility Placement, Discharge Planning                Permission sought to share information with:  Facility Medical sales representative, Family Supports Permission granted to share information::  Yes, Verbal Permission Granted  Name::     Dorinda Hill Cassel  Agency::  SNF's  Relationship::  Husband  Contact Information:  [phone]  Housing/Transportation Living arrangements for the past 2 months:  Single Family Home Source of Information:  Medical Team, Spouse Patient Interpreter Needed:  None Criminal Activity/Legal Involvement Pertinent to Current Situation/Hospitalization:  No - Comment as needed Significant Relationships:  Adult Children, Spouse Lives with:  Spouse Do you feel safe going back to the place where you live?  Yes Need for family participation in patient care:  Yes (Comment)  Care giving concerns:  PT recommending SNF once medically stable for discharge.   Social Worker assessment / plan:  Patient asleep and did not arouse to conversation. Patient's husband at bedside. CSW introduced role and explained that PT recommendations would be discussed. Patient's husband agreeable to SNF placement. His daughter wants to tour facilities before a decision is made. She has been to a facility in [place] in the past but he feels that it Maahs be too far for him to drive. SNF list provided for review. No further concerns. CSW encouraged patient's husband to contact CSW as needed. CSW will continue to follow patient and her husband for support and facilitate discharge to SNF once medically stable.  Employment status:  Retired Health and safety inspector:  Medicare PT Recommendations:  Skilled Nursing Facility Information / Referral to community resources:  Skilled Nursing Facility  Patient/Family's Response to care:   Patient not fully oriented/awake. Patient's husband agreeable to SNF placement. Patient's family supportive and involved in patient's care. Patient's husband appreciated social work intervention.  Patient/Family's Understanding of and Emotional Response to Diagnosis, Current Treatment, and Prognosis:  Patient not fully oriented/awake. Patient's husband has a good understanding of the reason for admission and her need for rehab prior to returning home. Patient's husband appears happy with hospital care.  Emotional Assessment Appearance:  Appears stated age Attitude/Demeanor/Rapport:  Unable to Assess Affect (typically observed):  Unable to Assess Orientation:  Oriented to Self, Oriented to Place Alcohol / Substance use:  Never Used Psych involvement (Current and /or in the community):  No (Comment)  Discharge Needs  Concerns to be addressed:  Care Coordination Readmission within the last 30 days:  No Current discharge risk:  Cognitively Impaired, Dependent with Mobility Barriers to Discharge:  Continued Medical Work up   Margarito Liner, LCSW 08/10/2017, 1:48 PM

## 2017-08-10 NOTE — Clinical Social Work Note (Signed)
CSW provided patient's husband with a list of bed offers so far.  Charlynn Court, CSW [phone]

## 2017-08-11 LAB — BASIC METABOLIC PANEL
ANION GAP: 7 (ref 5–15)
BUN: 40 mg/dL — AB (ref 6–20)
CHLORIDE: 106 mmol/L (ref 101–111)
CO2: 26 mmol/L (ref 22–32)
Calcium: 9 mg/dL (ref 8.9–10.3)
Creatinine, Ser: 1.57 mg/dL — ABNORMAL HIGH (ref 0.44–1.00)
GFR calc Af Amer: 37 mL/min — ABNORMAL LOW (ref >60)
GFR calc non Af Amer: 32 mL/min — ABNORMAL LOW (ref >60)
Glucose, Bld: 149 mg/dL — ABNORMAL HIGH (ref 65–99)
POTASSIUM: 4.1 mmol/L (ref 3.5–5.1)
SODIUM: 139 mmol/L (ref 135–145)

## 2017-08-11 LAB — CBC
HCT: 25.9 % — ABNORMAL LOW (ref 36.0–46.0)
HEMOGLOBIN: 8.5 g/dL — AB (ref 12.0–15.0)
MCH: 31.1 pg (ref 26.0–34.0)
MCHC: 32.8 g/dL (ref 30.0–36.0)
MCV: 94.9 fL (ref 78.0–100.0)
Platelets: 308 10*3/uL (ref 150–400)
RBC: 2.73 MIL/uL — AB (ref 3.87–5.11)
RDW: 13.1 % (ref 11.5–15.5)
WBC: 12.4 10*3/uL — AB (ref 4.0–10.5)

## 2017-08-11 LAB — GLUCOSE, CAPILLARY
GLUCOSE-CAPILLARY: 182 mg/dL — AB (ref 65–99)
GLUCOSE-CAPILLARY: 283 mg/dL — AB (ref 65–99)
Glucose-Capillary: 417 mg/dL — ABNORMAL HIGH (ref 65–99)
Glucose-Capillary: 303 mg/dL — ABNORMAL HIGH (ref 65–99)

## 2017-08-11 MED ORDER — INSULIN GLARGINE 100 UNIT/ML ~~LOC~~ SOLN
20.0000 [IU] | Freq: Two times a day (BID) | SUBCUTANEOUS | Status: DC
  Administered 2017-08-11 – 2017-08-13 (×4): 20 [IU] via SUBCUTANEOUS
  Filled 2017-08-11 (×5): qty 0.2

## 2017-08-11 MED ORDER — INSULIN ASPART 100 UNIT/ML ~~LOC~~ SOLN
4.0000 [IU] | Freq: Once | SUBCUTANEOUS | Status: AC
  Administered 2017-08-11: 4 [IU] via SUBCUTANEOUS

## 2017-08-11 MED ORDER — INSULIN ASPART 100 UNIT/ML ~~LOC~~ SOLN
10.0000 [IU] | Freq: Once | SUBCUTANEOUS | Status: AC
  Administered 2017-08-11: 10 [IU] via SUBCUTANEOUS

## 2017-08-11 NOTE — Clinical Social Work Note (Signed)
Received call from admissions coordinator at [facility] today that patient's daughter had accepted their bed offer and toured the facility. CSW confirmed with patient and her husband at bedside. Patient has a bed whenever ready for discharge.  Charlynn Court, CSW [phone]

## 2017-08-11 NOTE — Progress Notes (Signed)
Device interrogated with [employer]. Patient having fused ventricular beats, therefore AV delay extended. Otherwise device working properly.   Little Ishikawa, NP-C 5:14 PM

## 2017-08-11 NOTE — Progress Notes (Signed)
Pacemaker spiking after qrs intermittently, md made aware who claimed to call cardiologist.

## 2017-08-11 NOTE — Progress Notes (Signed)
Inpatient Diabetes Program Recommendations  AACE/ADA: New Consensus Statement on Inpatient Glycemic Control (2015)  Target Ranges:  Prepandial:   less than 140 mg/dL      Peak postprandial:   less than 180 mg/dL (1-2 hours)      Critically ill patients:  140 - 180 mg/dL   Lab Results  Component Value Date   GLUCAP 182 (H) 08/11/2017   HGBA1C 8.6 (H) 08/07/2017    Review of Glycemic Control Results for JAYCE, BOYKO (MRN [id number]) as of 08/11/2017 09:01  Ref. Range 08/10/2017 08:22 08/10/2017 12:49 08/10/2017 17:09 08/10/2017 21:11 08/11/2017 08:20  Glucose-Capillary Latest Ref Range: 65 - 99 mg/dL 119 (H) 147 (H) 829 (H) 175 (H) 182 (H)   Diabetes history:Type 2 DM Outpatient Diabetes medications: Lantus 15 units bid, Humulin R 2-6 units tid with meals Current orders for Inpatient glycemic control:  Novolog sensitive tid with meals, Lantus 15 units bid  Inpatient Diabetes Program Recommendations:    Noted postprandial CBGs elevated -Novolog 4 units tid meal coverage if eats 50% meals or supplement  Thank you, Darel Hong E. Kachina Niederer, RN, MSN, CDE  Diabetes Coordinator Inpatient Glycemic Control Team Team Pager [phone] (8am-5pm) 08/11/2017 9:02 AM

## 2017-08-11 NOTE — Progress Notes (Signed)
Orthopaedic Trauma Progress Note  S: Doing well this AM, no orthopaedic complaints  O:  Vitals:   08/11/17 0700 08/11/17 0800  BP: (!) 120/52   Pulse:  63  Resp:    Temp: 99.5 F (37.5 C)   SpO2:  94%  General-Awake but not oriented RLE: Incisions clean, dry and intact. Intact EHL/FHL, GSC/TA. Sensation intact to light touch.  A/P: 71 yo female POD4 s/p ORIF supracondylar distal femur  -NWB RLE -Knee ROM as tolerated -PT/OT -Follow up in 2-3 weeks for x-rays and wound check. Please call [phone] to schedule appointment with me. -[place] shower but do not soak wounds  Roby Lofts, MD [facility] [phone] (phone)

## 2017-08-11 NOTE — Progress Notes (Signed)
Notified MD about pt's cbg 303. Will continue to monitor Karena Addison T

## 2017-08-11 NOTE — Progress Notes (Addendum)
Physical Therapy Treatment Patient Details Name: Sonya Campbell MRN: [id number] DOB: 1946-09-27 Today's Date: 08/11/2017    History of Present Illness Sonya Campbell is an 71 y.o. female with HTN, CAD, CKD stage 3, CHF, PCM implant, Neuropathy, Dementia, hyperthyroidism admitted on 9.24.2018 for right distal femur fracture after mechanical fall. She underwent ORIF under general anesthesia. She is lethargic post op which was attributed to GA and was undergoing frequent neurochecks. At 5.15 am Rapid response was called as patient was more lethargic, difficult to arouse with sternal rub and no longer following any commands.there is also questionable right facial droop noted as well as left eye twitching.  Neurology was notified of change in mental status.    PT Comments    Pt admitted with above diagnosis. Pt currently with functional limitations due to the deficits listed below (see PT Problem List). Pt was able to stand with mod assist +2 for about 40 seconds maintaining TDWB with cues.  Pt very fatigued and could not stand a second time.  Lateral scoot transfer to chair with pad with max assist of 2.  Progressing each day. Decr frequency to 3xweek to be in line with standards of care.  Noted Dr. Jena Gauss had placed TDWB right LE in orders and NWB in progress notes.  Called to clarify and MD states he is fine with TDWB for balance for transfers just limit which is what pt is able to do with cues.  Pt will benefit from skilled PT to increase their independence and safety with mobility to allow discharge to the venue listed below.     Follow Up Recommendations  SNF;Supervision/Assistance - 24 hour     Equipment Recommendations  None recommended by PT    Recommendations for Other Services       Precautions / Restrictions Precautions Precautions: Fall Restrictions Weight Bearing Restrictions: Yes RLE Weight Bearing: Touchdown weight bearing for transfers only per MD.    Mobility  Bed  Mobility Overal bed mobility: Needs Assistance Bed Mobility: Supine to Sit     Supine to sit: +2 for physical assistance;Mod assist;Min assist     General bed mobility comments: Pt initiated movement and needed assist with LES and elevation of trunk.   Transfers Overall transfer level: Needs assistance Equipment used: Rolling walker (2 wheeled) Transfers: Lateral/Scoot Transfers;Sit to/from Stand Sit to Stand: Mod assist;Max assist;+2 physical assistance;From elevated surface        Lateral/Scoot Transfers: Max assist;+2 physical assistance;From elevated surface General transfer comment: Pt had BM on arrival.  Sit to stand to RW with mod assist first time with pt able to hold herself up a bit to be cleaned with pt able to hold her right LE up and maintain TDWB on right LE.  Needed cues to stand tall as she was flexing her head and trunk and not using UEs as she should.  Tried to stand pt a second time and pt unable with max assist therefore scoot transfer to drop arm recliner using pad to  assist. Had to maintain TDWB right LE as well for transfer to chair.    Ambulation/Gait                 Stairs            Wheelchair Mobility    Modified Rankin (Stroke Patients Only)       Balance Overall balance assessment: Needs assistance Sitting-balance support: Feet supported;Bilateral upper extremity supported Sitting balance-Leahy Scale: Fair Sitting balance - Comments:  Sat 10 min and can perform some reaching.     Standing balance support: Bilateral upper extremity supported;During functional activity Standing balance-Leahy Scale: Poor Standing balance comment: Pt was able to stand with RW but needing mod assist and not fully upright.  Cues to maintain TDWB right LE.                            Cognition Arousal/Alertness: Awake/alert Behavior During Therapy: Flat affect Overall Cognitive Status: Impaired/Different from baseline Area of Impairment:  Orientation;Attention;Following commands;Safety/judgement;Awareness;Problem solving                 Orientation Level: Disoriented to;Time;Situation Current Attention Level: Focused   Following Commands: Follows one step commands with increased time Safety/Judgement: Decreased awareness of safety;Decreased awareness of deficits   Problem Solving: Slow processing;Decreased initiation;Requires verbal cues;Requires tactile cues General Comments: Pt continues to be more alert and responsive today. Not as much of a delay in response time.       Exercises General Exercises - Lower Extremity Ankle Circles/Pumps: AAROM;Both;5 reps;Supine Quad Sets: AROM;Both;5 reps;Supine Long Arc Quad: AROM;Both;5 reps;Seated Heel Slides: AAROM;Both;Supine;5 reps    General Comments        Pertinent Vitals/Pain Pain Assessment: Faces Faces Pain Scale: Hurts little more Pain Location: right LE Pain Descriptors / Indicators: Aching;Discomfort;Grimacing;Guarding;Operative site guarding Pain Intervention(s): Limited activity within patient's tolerance;Monitored during session;Repositioned   VSS Home Living                      Prior Function            PT Goals (current goals can now be found in the care plan section) Progress towards PT goals: Progressing toward goals    Frequency    Min 3X/week      PT Plan Frequency needs to be updated    Co-evaluation              AM-PAC PT "6 Clicks" Daily Activity  Outcome Measure  Difficulty turning over in bed (including adjusting bedclothes, sheets and blankets)?: Unable Difficulty moving from lying on back to sitting on the side of the bed? : Unable Difficulty sitting down on and standing up from a chair with arms (e.g., wheelchair, bedside commode, etc,.)?: Unable Help needed moving to and from a bed to chair (including a wheelchair)?: Total Help needed walking in hospital room?: Total Help needed climbing 3-5 steps  with a railing? : Total 6 Click Score: 6    End of Session Equipment Utilized During Treatment: Gait belt;Oxygen Activity Tolerance: Patient limited by fatigue Patient left: with call bell/phone within reach;with family/visitor present;in chair;with chair alarm set Nurse Communication: Mobility status;Need for lift equipment PT Visit Diagnosis: Muscle weakness (generalized) (M62.81);Pain Pain - Right/Left: Right Pain - part of body: Leg     Time: 1610-9604 PT Time Calculation (min) (ACUTE ONLY): 29 min  Charges:  $Gait Training: 8-22 mins $Therapeutic Exercise: 8-22 mins                    G Codes:       Ame Heagle,PT Acute Rehabilitation [phone] (pager)    Berline Lopes 08/11/2017, 12:52 PM

## 2017-08-11 NOTE — Progress Notes (Signed)
Cbg-417mg /dl, MD aware with order.

## 2017-08-11 NOTE — Care Management Note (Signed)
Case Management Note  Patient Details  Name: Sonya Campbell MRN: [id number] Date of Birth: [birth year]  Subjective/Objective:                 Admitted with closed fx of r distal femur.   Action/Plan:  Anticipate DC to SNF when medically stable.  Expected Discharge Date:                  Expected Discharge Plan:  Skilled Nursing Facility  In-House Referral:  Clinical Social Work  Discharge planning Services  CM Consult  Post Acute Care Choice:    Choice offered to:     DME Arranged:    DME Agency:     HH Arranged:    HH Agency:     Status of Service:     If discussed at [employer], dates discussed:    Additional Comments:  Lawerance Sabal, RN 08/11/2017, 4:06 PM

## 2017-08-11 NOTE — Progress Notes (Signed)
PROGRESS NOTE    Sonya Campbell  ZOX:[id number] DOB: 05-31-1946 DOA: 08/06/2017 PCP: Donita Brooks, MD   Brief Narrative: 71 year old female with history of hypertension, dyslipidemia, depression, dementia, chronic diastolic congestive heart failure, status post aVR, bovine valve, fibromyalgia, chronic kidney disease stage III, CAD, CABG, hyperparathyroidism presented after a fall sustaining a right thigh pain. Patient with a closed fracture of right distal femur.S/P ORIF on 9/24.   Assessment & Plan:  # Fall with right distal femur fracture: - s/p ORIF on 9/24.  -As per ortho; nonweightbearing to the right leg. No ROM restrictions.  Lovenox for VTE prophylaxis. I have discussed with Dr. Jena Gauss today. -PT, OT evaluation and possible rehabilitation on discharge. -Social worker consulted.  # Lethargy, acute metabolic encephalopathy in the setting of anesthesia/surgery and pain medication: -Patient is alert awake and oriented. She is able to eat by herself. -Patient was evaluated by neurologist. CT scan and EEG unremarkable. Continue to provide supportive care PT OT and social worker evaluation for safe discharge to skilled facility. -I discussed with the patient's daughter and husband at bedside today. Patient has early stage dementia as per patient's husband. -Reduced gabapentin to 300 mg bid. Pt has reduced GFR.  # History of coronary artery disease, has pacemaker: As per nursing staff and telemetric the pacemaker is not sensing. I consulted cardiology to evaluate. Bonnell need ICD interrogation. Continue current medication.  # Mild fever likely related with surgical stress. UA and chest x-ray with no sign of infection. Patient is now afebrile.  #Hypertension. Blood pressure elevated. Continue Lasix, Imdur and metoprolol. Monitor blood pressure. Pain management.  #History of coronary artery disease: On metoprolol, pravastatin.   #Type 2 diabetes with hyperglycemia: Continue current  insulin regimen. Monitor blood sugar level.  # Hyperthyroidism: Continue methimazole. Patient is not on propranolol at home, as per pharmacy.Marland Kitchen  #Chronic diastolic congestive heart failure: On metoprolol.  #Chronic kidney disease stage III: Serum creatinine level is stable. Monitor BMP. Avoid nephrotoxins  #Hypokalemia: Replete potassium. Serum potassium level improved.   #Peripheral neuropathy: Lower the dose of Neurontin.  #Acute blood loss anemia likely during surgery: Hemoglobin is stable. No sign of bleeding.  DVT prophylaxis:lovenox sq as per ortho Code Status: Full code Family Communication: Discussed with the patient has pain and daughter at bedside.  Disposition Plan: Likely discharge to skilled nursing facility when bed is available and after evaluation by cardiologist and the plan.    Consultants:   Orthopedics  Neurology  Cardiology  Procedures: ORIF  Antimicrobials:none  Subjective: Seen and examined at bedside. Mental status improved. Able to eat. Family at bedside. Denied headache, dizziness, nausea vomiting. Objective: Vitals:   08/11/17 0140 08/11/17 0300 08/11/17 0700 08/11/17 0800  BP: (!) 173/59 (!) 171/55 (!) 120/52   Pulse: 63 60  63  Resp: 14 14    Temp:  98.6 F (37 C) 99.5 F (37.5 C)   TempSrc:  Oral Oral   SpO2: 96% 97%  94%  Weight:      Height:        Intake/Output Summary (Last 24 hours) at 08/11/17 1120 Last data filed at 08/10/17 2320  Gross per 24 hour  Intake                0 ml  Output              500 ml  Net             -500 ml   Ceasar Mons  Weights   08/07/17 1808  Weight: 94.2 kg (207 lb 9.6 oz)    Examination:  General exam: Not in distress, lying on bed comfortable Respiratory system: Clear bilaterally, respiratory effort normal Cardiovascular system: Regular rate rhythm, S1-S2 normal Gastrointestinal system: Abdomen soft, nontender, nondistended. Central nervous system: Alert awake and responsive to name. Skin:  Right lower extremity with dressing applied. Psychiatry: Alert awake and following commands.    Data Reviewed: I have personally reviewed following labs and imaging studies  CBC:  Recent Labs Lab 08/07/17 0115  08/08/17 0214 08/08/17 0749 08/09/17 0313 08/10/17 0320 08/11/17 0315  WBC 14.9*  --  15.2* 15.1* 13.7* 12.0* 12.4*  NEUTROABS 12.3*  --   --   --   --   --   --   HGB 11.1*  < > 8.7* 8.5* 8.6* 7.7* 8.5*  HCT 33.9*  < > 25.9* 25.9* 26.1* 23.3* 25.9*  MCV 93.9  --  94.9 95.2 95.6 96.3 94.9  PLT 302  --  244 215 225 240 308  < > = values in this interval not displayed. Basic Metabolic Panel:  Recent Labs Lab 08/07/17 0115  08/07/17 1508 08/08/17 0214 08/09/17 0313 08/10/17 0320 08/11/17 0315  NA 136  < > 145 139 141 140 139  K 4.2  < > 3.7 3.9 3.7 3.3* 4.1  CL 101  --   --  106 107 106 106  CO2 24  --   --  GLUCOSE 359*  < > 248* 186* 194* 210* 149*  BUN 23*  --   --  30* 33* 38* 40*  CREATININE 1.53*  --   --  1.65* 1.71* 1.71* 1.57*  CALCIUM 9.2  --   --  8.5* 8.6* 8.5* 9.0  < > = values in this interval not displayed. GFR: Estimated Creatinine Clearance: 34.1 mL/min (A) (by C-G formula based on SCr of 1.57 mg/dL (H)). Liver Function Tests: No results for input(s): AST, ALT, ALKPHOS, BILITOT, PROT, ALBUMIN in the last 168 hours. No results for input(s): LIPASE, AMYLASE in the last 168 hours. No results for input(s): AMMONIA in the last 168 hours. Coagulation Profile:  Recent Labs Lab 08/07/17 0115  INR 1.00   Cardiac Enzymes: No results for input(s): CKTOTAL, CKMB, CKMBINDEX, TROPONINI in the last 168 hours. BNP (last 3 results) No results for input(s): PROBNP in the last 8760 hours. HbA1C: No results for input(s): HGBA1C in the last 72 hours. CBG:  Recent Labs Lab 08/10/17 0822 08/10/17 1249 08/10/17 1709 08/10/17 2111 08/11/17 0820  GLUCAP 230* 317* 242* 175* 182*   Lipid Profile: No results for input(s): CHOL, HDL,  LDLCALC, TRIG, CHOLHDL, LDLDIRECT in the last 72 hours. Thyroid Function Tests: No results for input(s): TSH, T4TOTAL, FREET4, T3FREE, THYROIDAB in the last 72 hours. Anemia Panel: No results for input(s): VITAMINB12, FOLATE, FERRITIN, TIBC, IRON, RETICCTPCT in the last 72 hours. Sepsis Labs: No results for input(s): PROCALCITON, LATICACIDVEN in the last 168 hours.  Recent Results (from the past 240 hour(s))  Surgical pcr screen     Status: None   Collection Time: 08/07/17  9:01 AM  Result Value Ref Range Status   MRSA, PCR NEGATIVE NEGATIVE Final   Staphylococcus aureus NEGATIVE NEGATIVE Final    Comment: (NOTE) The Xpert SA Assay (FDA approved for NASAL specimens in patients 40 years of age and older), is one component of a comprehensive surveillance program. It is not intended to diagnose infection nor to  guide or monitor treatment.   Culture, Urine     Status: None   Collection Time: 08/07/17  9:39 AM  Result Value Ref Range Status   Specimen Description URINE, RANDOM  Final   Special Requests NONE  Final   Culture NO GROWTH  Final   Report Status 08/08/2017 FINAL  Final         Radiology Studies: Ct Head Wo Contrast  Result Date: 08/09/2017 CLINICAL DATA:  Altered level of consciousness. EXAM: CT HEAD WITHOUT CONTRAST TECHNIQUE: Contiguous axial images were obtained from the base of the skull through the vertex without intravenous contrast. COMPARISON:  CT 08/08/2017 FINDINGS: Brain: Ventricle size normal. Mild atrophy. Mild white matter hypodensity bilaterally unchanged. Negative for acute infarct. Negative for hemorrhage or mass. No interval change. Vascular: Negative for hyperdense vessel Skull: Negative Sinuses/Orbits: Prior medial antrostomy left maxillary sinus. Marked bony thickening and traction left maxillary sinus with associated mucosal edema unchanged from yesterday. Remaining sinuses clear. Bilateral cataract removal. Other: None IMPRESSION: No acute abnormality  no change from yesterday. Atrophy and mild chronic microvascular ischemic change in the white matter. Electronically Signed   By: Marlan Palau M.D.   On: 08/09/2017 13:03        Scheduled Meds: . baclofen  10 mg Oral BID  . brimonidine  1 drop Both Eyes Q8H  . dicyclomine  20 mg Oral BID  . doxepin  10 mg Oral QHS  . enoxaparin (LOVENOX) injection  30 mg Subcutaneous Q24H  . feeding supplement (GLUCERNA SHAKE)  237 mL Oral TID BM  . fluorometholone  1 drop Both Eyes QID  . FLUoxetine  20 mg Oral QHS  . FLUoxetine  40 mg Oral Daily  . furosemide  20 mg Oral Daily  . gabapentin  300 mg Oral BID  . Influenza vac split quadrivalent PF  0.5 mL Intramuscular Tomorrow-1000  . insulin aspart  0-9 Units Subcutaneous TID WC  . insulin glargine  15 Units Subcutaneous BID  . isosorbide mononitrate  60 mg Oral Daily  . latanoprost  1 drop Both Eyes QHS  . mouth rinse  15 mL Mouth Rinse BID  . methimazole  5 mg Oral q morning - 10a  . metoprolol tartrate  50 mg Oral BID  . polyethylene glycol  17 g Oral Daily  . pravastatin  80 mg Oral q1800  . pyridoxine  100 mg Oral Daily  . timolol  1 drop Both Eyes BID  . triamcinolone ointment   Topical BID  . valACYclovir  500 mg Oral Daily  . vitamin B-12  1,000 mcg Oral Daily   Continuous Infusions:    LOS: 4 days    Alexya Mcdaris Jaynie Collins, MD [facility] Pager [phone]  If 7PM-7AM, please contact night-coverage [email or web address] Password [email or web address] 08/11/2017, 11:20 AM

## 2017-08-12 LAB — GLUCOSE, CAPILLARY
GLUCOSE-CAPILLARY: 182 mg/dL — AB (ref 65–99)
GLUCOSE-CAPILLARY: 258 mg/dL — AB (ref 65–99)
Glucose-Capillary: 219 mg/dL — ABNORMAL HIGH (ref 65–99)
Glucose-Capillary: 269 mg/dL — ABNORMAL HIGH (ref 65–99)

## 2017-08-12 LAB — BASIC METABOLIC PANEL
ANION GAP: 8 (ref 5–15)
BUN: 38 mg/dL — ABNORMAL HIGH (ref 6–20)
CALCIUM: 8.8 mg/dL — AB (ref 8.9–10.3)
CHLORIDE: 103 mmol/L (ref 101–111)
CO2: 25 mmol/L (ref 22–32)
CREATININE: 1.64 mg/dL — AB (ref 0.44–1.00)
GFR calc non Af Amer: 30 mL/min — ABNORMAL LOW (ref >60)
GFR, EST AFRICAN AMERICAN: 35 mL/min — AB (ref >60)
Glucose, Bld: 220 mg/dL — ABNORMAL HIGH (ref 65–99)
Potassium: 4 mmol/L (ref 3.5–5.1)
SODIUM: 136 mmol/L (ref 135–145)

## 2017-08-12 LAB — BLOOD GAS, ARTERIAL
ACID-BASE EXCESS: 1.8 mmol/L (ref 0.0–2.0)
Bicarbonate: 25.3 mmol/L (ref 20.0–28.0)
DRAWN BY: 10552
FIO2: 0.21
O2 Saturation: 94.2 %
PCO2 ART: 36.3 mmHg (ref 32.0–48.0)
Patient temperature: 98.6
pH, Arterial: 7.458 — ABNORMAL HIGH (ref 7.350–7.450)
pO2, Arterial: 67.2 mmHg — ABNORMAL LOW (ref 83.0–108.0)

## 2017-08-12 LAB — CBC
HEMATOCRIT: 25.7 % — AB (ref 36.0–46.0)
HEMOGLOBIN: 8.4 g/dL — AB (ref 12.0–15.0)
MCH: 30.9 pg (ref 26.0–34.0)
MCHC: 32.7 g/dL (ref 30.0–36.0)
MCV: 94.5 fL (ref 78.0–100.0)
Platelets: 301 10*3/uL (ref 150–400)
RBC: 2.72 MIL/uL — AB (ref 3.87–5.11)
RDW: 13.1 % (ref 11.5–15.5)
WBC: 12.2 10*3/uL — AB (ref 4.0–10.5)

## 2017-08-12 MED ORDER — GABAPENTIN 100 MG PO CAPS
100.0000 mg | ORAL_CAPSULE | Freq: Two times a day (BID) | ORAL | Status: DC
  Administered 2017-08-13: 100 mg via ORAL
  Filled 2017-08-12: qty 1

## 2017-08-12 MED ORDER — TRAMADOL HCL 50 MG PO TABS: 50.0000 mg | ORAL_TABLET | Freq: Four times a day (QID) | ORAL | 0 refills | Status: AC | PRN

## 2017-08-12 MED ORDER — BACLOFEN 10 MG PO TABS
10.0000 mg | ORAL_TABLET | Freq: Two times a day (BID) | ORAL | Status: DC | PRN
  Administered 2017-08-12: 10 mg via ORAL
  Filled 2017-08-12 (×2): qty 1

## 2017-08-12 NOTE — Clinical Social Work Note (Signed)
SNF admissions coordinator stated patient has to be in their building by 5:00 pm today or discharge will have to be postponed until tomorrow. This is due to many late night admissions that affect care at the facility. CSW paged MD to notify.  Charlynn Court, CSW [phone]

## 2017-08-12 NOTE — Progress Notes (Signed)
Patient is now awake and alert. She has taken her morning medications. Blood pressure is elevated, since PO medications late, will wait a bit to see if there is improvement. Made MD aware. Will continue to monitor. Husband is at bedside.  Per MD-Will hold night time doses of gabapentin and baclofen due to increase in daytime sleepiness.

## 2017-08-12 NOTE — Progress Notes (Signed)
Patient continues to be lethargic and unable to take pills safely. Will continue to monitor. MD made aware.

## 2017-08-12 NOTE — Progress Notes (Signed)
PROGRESS NOTE    Sonya Campbell  ZOX:[id number] DOB: May 31, 1946 DOA: 08/06/2017 PCP: Donita Brooks, MD   Brief Narrative: 71 year old female with history of hypertension, dyslipidemia, depression, dementia, chronic diastolic congestive heart failure, status post aVR, bovine valve, fibromyalgia, chronic kidney disease stage III, CAD, CABG, hyperparathyroidism presented after a fall sustaining a right thigh pain. Patient with a closed fracture of right distal femur.S/P ORIF on 9/24.   Assessment & Plan:  # Fall with right distal femur fracture: - s/p ORIF on 9/24.  -As per ortho; nonweightbearing to the right leg. No ROM restrictions.  Lovenox for VTE prophylaxis. I have discussed with Dr. Jena Gauss today. -PT, OT evaluation and possible rehabilitation on discharge. -Social worker consulted.  # Lethargy, acute metabolic encephalopathy in the setting of anesthesia/surgery and pain medication:? Acute delirium -Patient was evaluated by neurologist. CT scan and EEG unremarkable. Patient's mental status was improved until this morning when she was lethargic. She is minimally responsive with her name but not following commands and not taking the pills. I will change baclofen to as needed and reduce the dose of gabapentin 200 mg twice a day. Patient was at high doses at home. Because of lethargy and not able to take by mouth, patient is not able to go to a skilled facility today. Monitor closely. Recheck labs and ABG. I discussed with the patient's daughter and nursing staff.  # History of coronary artery disease, has pacemaker: Evaluated by cardiology and the device was interrogated in the hospital.  # Mild fever likely related with surgical stress. UA and chest x-ray with no sign of infection. Patient is now afebrile.  #Hypertension. Blood pressure elevated. Continue Lasix, Imdur and metoprolol. Monitor blood pressure. Pain management.  #History of coronary artery disease: On metoprolol,  pravastatin.   #Type 2 diabetes with hyperglycemia: Continue current insulin regimen. Monitor blood sugar level.  # Hyperthyroidism: Continue methimazole. Patient is not on propranolol at home, as per pharmacy.Marland Kitchen  #Chronic diastolic congestive heart failure: On metoprolol.  #Chronic kidney disease stage III: Serum creatinine level is stable. Monitor BMP. Avoid nephrotoxins  #Hypokalemia: Replete potassium. Serum potassium level improved.   #Peripheral neuropathy: Lower the dose of Neurontin.  #Acute blood loss anemia likely during surgery: Hemoglobin is stable. No sign of bleeding.  DVT prophylaxis:lovenox sq as per ortho Code Status: Full code Family Communication: Discussed with the patient's daughter at bedside. Disposition Plan: Likely discharge to skilled nursing facility when medically status improves and able to take by mouth.   Consultants:   Orthopedics  Neurology  Cardiology  Procedures: ORIF  Antimicrobials:none  Subjective: Seen and examined at bedside. Patient was lethargic this morning. She is responding with her name but not opening her eyes and not taking her pills. Daughter at bedside. No focal neurological deficit. Objective: Vitals:   08/12/17 0000 08/12/17 0316 08/12/17 0800 08/12/17 1154  BP: (!) 112/48 (!) 117/53    Pulse:  60 60 60  Resp: 20 16    Temp:  98 F (36.7 C) (!) 97.4 F (36.3 C) 97.7 F (36.5 C)  TempSrc:  Axillary Axillary Axillary  SpO2:  97%  94%  Weight:      Height:        Intake/Output Summary (Last 24 hours) at 08/12/17 1430 Last data filed at 08/12/17 0945  Gross per 24 hour  Intake              240 ml  Output  1750 ml  Net            -1510 ml   Filed Weights   08/07/17 1808  Weight: 94.2 kg (207 lb 9.6 oz)    Examination:  General exam: Patient is more somnolent, answers with the name but not following commands and not taking pills. Respiratory system: Clear bilateral, respiratory effort  normal Cardiovascular system: Regular rate rhythm, S1 is normal Gastrointestinal system: Abdomen soft, nontender, nondistended Central nervous system: Somnolent Skin: Right lower extremity with dressing applied.    Data Reviewed: I have personally reviewed following labs and imaging studies  CBC:  Recent Labs Lab 08/07/17 0115  08/08/17 0214 08/08/17 0749 08/09/17 0313 08/10/17 0320 08/11/17 0315  WBC 14.9*  --  15.2* 15.1* 13.7* 12.0* 12.4*  NEUTROABS 12.3*  --   --   --   --   --   --   HGB 11.1*  < > 8.7* 8.5* 8.6* 7.7* 8.5*  HCT 33.9*  < > 25.9* 25.9* 26.1* 23.3* 25.9*  MCV 93.9  --  94.9 95.2 95.6 96.3 94.9  PLT 302  --  244 215 225 240 308  < > = values in this interval not displayed. Basic Metabolic Panel:  Recent Labs Lab 08/07/17 0115  08/07/17 1508 08/08/17 0214 08/09/17 0313 08/10/17 0320 08/11/17 0315  NA 136  < > 145 139 141 140 139  K 4.2  < > 3.7 3.9 3.7 3.3* 4.1  CL 101  --   --  106 107 106 106  CO2 24  --   --  GLUCOSE 359*  < > 248* 186* 194* 210* 149*  BUN 23*  --   --  30* 33* 38* 40*  CREATININE 1.53*  --   --  1.65* 1.71* 1.71* 1.57*  CALCIUM 9.2  --   --  8.5* 8.6* 8.5* 9.0  < > = values in this interval not displayed. GFR: Estimated Creatinine Clearance: 34.1 mL/min (A) (by C-G formula based on SCr of 1.57 mg/dL (H)). Liver Function Tests: No results for input(s): AST, ALT, ALKPHOS, BILITOT, PROT, ALBUMIN in the last 168 hours. No results for input(s): LIPASE, AMYLASE in the last 168 hours. No results for input(s): AMMONIA in the last 168 hours. Coagulation Profile:  Recent Labs Lab 08/07/17 0115  INR 1.00   Cardiac Enzymes: No results for input(s): CKTOTAL, CKMB, CKMBINDEX, TROPONINI in the last 168 hours. BNP (last 3 results) No results for input(s): PROBNP in the last 8760 hours. HbA1C: No results for input(s): HGBA1C in the last 72 hours. CBG:  Recent Labs Lab 08/11/17 1156 08/11/17 1559 08/11/17 2132  08/12/17 0801 08/12/17 1152  GLUCAP 283* 417* 303* 182* 219*   Lipid Profile: No results for input(s): CHOL, HDL, LDLCALC, TRIG, CHOLHDL, LDLDIRECT in the last 72 hours. Thyroid Function Tests: No results for input(s): TSH, T4TOTAL, FREET4, T3FREE, THYROIDAB in the last 72 hours. Anemia Panel: No results for input(s): VITAMINB12, FOLATE, FERRITIN, TIBC, IRON, RETICCTPCT in the last 72 hours. Sepsis Labs: No results for input(s): PROCALCITON, LATICACIDVEN in the last 168 hours.  Recent Results (from the past 240 hour(s))  Surgical pcr screen     Status: None   Collection Time: 08/07/17  9:01 AM  Result Value Ref Range Status   MRSA, PCR NEGATIVE NEGATIVE Final   Staphylococcus aureus NEGATIVE NEGATIVE Final    Comment: (NOTE) The Xpert SA Assay (FDA approved for NASAL specimens in patients 22 years of age  and older), is one component of a comprehensive surveillance program. It is not intended to diagnose infection nor to guide or monitor treatment.   Culture, Urine     Status: None   Collection Time: 08/07/17  9:39 AM  Result Value Ref Range Status   Specimen Description URINE, RANDOM  Final   Special Requests NONE  Final   Culture NO GROWTH  Final   Report Status 08/08/2017 FINAL  Final         Radiology Studies: No results found.      Scheduled Meds: . baclofen  10 mg Oral BID  . brimonidine  1 drop Both Eyes Q8H  . dicyclomine  20 mg Oral BID  . doxepin  10 mg Oral QHS  . enoxaparin (LOVENOX) injection  30 mg Subcutaneous Q24H  . feeding supplement (GLUCERNA SHAKE)  237 mL Oral TID BM  . fluorometholone  1 drop Both Eyes QID  . FLUoxetine  20 mg Oral QHS  . FLUoxetine  40 mg Oral Daily  . furosemide  20 mg Oral Daily  . gabapentin  300 mg Oral BID  . Influenza vac split quadrivalent PF  0.5 mL Intramuscular Tomorrow-1000  . insulin aspart  0-9 Units Subcutaneous TID WC  . insulin glargine  20 Units Subcutaneous BID  . isosorbide mononitrate  60 mg Oral  Daily  . latanoprost  1 drop Both Eyes QHS  . mouth rinse  15 mL Mouth Rinse BID  . methimazole  5 mg Oral q morning - 10a  . metoprolol tartrate  50 mg Oral BID  . polyethylene glycol  17 g Oral Daily  . pravastatin  80 mg Oral q1800  . pyridoxine  100 mg Oral Daily  . timolol  1 drop Both Eyes BID  . triamcinolone ointment   Topical BID  . valACYclovir  500 mg Oral Daily  . vitamin B-12  1,000 mcg Oral Daily   Continuous Infusions:    LOS: 5 days    Sonya Cassedy Jaynie Collins, MD [facility] Pager [phone]  If 7PM-7AM, please contact night-coverage [email or web address] Password [email or web address] 08/12/2017, 2:30 PM

## 2017-08-12 NOTE — Progress Notes (Signed)
Patient is somnolent and not responding to voice. She is unable to safely take medications at this time. She does respond to painful stimuli. Follows minimal commands. She will not open her eyes.   Daughter at bedside and is concerned. MD made aware and is at bedside.

## 2017-08-13 LAB — GLUCOSE, CAPILLARY
GLUCOSE-CAPILLARY: 155 mg/dL — AB (ref 65–99)
Glucose-Capillary: 316 mg/dL — ABNORMAL HIGH (ref 65–99)

## 2017-08-13 MED ORDER — TIMOLOL MALEATE 0.5 % OP SOLN: 1.0000 [drp] | Freq: Two times a day (BID) | OPHTHALMIC | 0 refills | Status: AC

## 2017-08-13 MED ORDER — GABAPENTIN 100 MG PO CAPS: 100.0000 mg | ORAL_CAPSULE | Freq: Two times a day (BID) | ORAL | Status: AC

## 2017-08-13 MED ORDER — SENNOSIDES-DOCUSATE SODIUM 8.6-50 MG PO TABS: 1.0000 | ORAL_TABLET | Freq: Every evening | ORAL | Status: AC | PRN

## 2017-08-13 MED ORDER — BACLOFEN 10 MG PO TABS: 10.0000 mg | ORAL_TABLET | Freq: Two times a day (BID) | ORAL | 0 refills | Status: AC | PRN

## 2017-08-13 MED ORDER — ENOXAPARIN SODIUM 30 MG/0.3ML ~~LOC~~ SOLN: 30.0000 mg | SUBCUTANEOUS | 0 refills | Status: AC

## 2017-08-13 MED ORDER — POLYETHYLENE GLYCOL 3350 17 G PO PACK: 17.0000 g | PACK | Freq: Every day | ORAL | 0 refills | Status: AC | PRN

## 2017-08-13 NOTE — Clinical Social Work Placement (Signed)
   CLINICAL SOCIAL WORK PLACEMENT  NOTE  Date:  08/13/2017  Patient Details  Name: Sonya Campbell MRN: [id number] Date of Birth: June 10, 1946  Clinical Social Work is seeking post-discharge placement for this patient at the Skilled  Nursing Facility level of care (*CSW will initial, date and re-position this form in  chart as items are completed):  Yes   Patient/family provided with [facility] Clinical Social Work Department's list of facilities offering this level of care within the geographic area requested by the patient (or if unable, by the patient's family).  Yes   Patient/family informed of their freedom to choose among providers that offer the needed level of care, that participate in Medicare, Medicaid or managed care program needed by the patient, have an available bed and are willing to accept the patient.  Yes   Patient/family informed of [facility]'s ownership interest in [facility] and [facility], as well as of the fact that they are under no obligation to receive care at these facilities.  PASRR submitted to EDS on 08/10/17     PASRR number received on       Existing PASRR number confirmed on 08/10/17     FL2 transmitted to all facilities in geographic area requested by pt/family on 08/10/17     FL2 transmitted to all facilities within larger geographic area on       Patient informed that his/her managed care company has contracts with or will negotiate with certain facilities, including the following:        Yes   Patient/family informed of bed offers received.  Patient chooses bed at [facility]     Physician recommends and patient chooses bed at      Patient to be transferred to [facility] on 08/13/17.  Patient to be transferred to facility by [facility]     Patient family notified on 08/13/17 of transfer.  Name of family member notified:  Dorinda Hill Woolman     PHYSICIAN Please sign FL2     Additional Comment:     _______________________________________________ Margarito Liner, LCSW 08/13/2017, 1:06 PM

## 2017-08-13 NOTE — Clinical Social Work Note (Signed)
CSW facilitated patient discharge including contacting patient family and facility to confirm patient discharge plans. Clinical information faxed to facility and family agreeable with plan. CSW arranged ambulance transport via [facility] to [facility]. RN to call report prior to discharge ([phone]).  CSW will sign off for now as social work intervention is no longer needed. Please consult us again if new needs arise.  Katoria Yetman, CSW [phone]  

## 2017-08-13 NOTE — Discharge Summary (Signed)
Physician Discharge Summary  Sonya Campbell ZOX:[id number] DOB: [birth year] DOA: 08/06/2017  PCP: Donita Brooks, MD  Admit date: 08/06/2017 Discharge date: 08/13/2017  Admitted From:Home Disposition:SNF  Recommendations for Outpatient Follow-up:  1. Follow up with PCP in 1-2 weeks 2. Please obtain BMP/CBC in one week  Home Health: SNF Equipment/Devices:none Discharge Condition:stable CODE STATUS:full code Diet recommendation:carb modified heart healthy  Brief/Interim Summary: 71 year old female with history of hypertension, dyslipidemia, depression, dementia, chronic diastolic congestive heart failure, status post aVR, bovine valve, fibromyalgia, chronic kidney disease stage III, CAD, CABG, hyperparathyroidism presented after a fall sustaining a right thigh pain. Patient with a closed fracture of right distal femur.S/P ORIF on 9/24.   # Fall with right distal femur fracture: - s/p ORIF on 9/24.  -As per ortho; nonweightbearing to the right leg. No ROM restrictions.  Lovenox for VTE prophylaxis.  -Outpatient follow-up with orthopedics.  # Lethargy, acute metabolic encephalopathy in the setting of anesthesia/surgery and pain medication:? Acute delirium -Patient was evaluated by neurologist. CT scan and EEG unremarkable.  -Mental status improved. Patient was sitting on chair and eating breakfast this morning. The dose of Neurontin reduced and baclofen what changed to as needed. Minimize sedatives.  # History of coronary artery disease, has pacemaker: Evaluated by cardiology and the device was interrogated in the hospital.  # Mild fever likely related with surgical stress. UA and chest x-ray with no sign of infection. Patient is now afebrile.  #Hypertension. Continue Lasix, Imdur and metoprolol. Monitor blood pressure. Pain management.  #History of coronary artery disease: On metoprolol, pravastatin.   #Type 2 diabetes with hyperglycemia: Continue current insulin regimen.  Monitor blood sugar level.  # Hyperthyroidism: Continue methimazole. PCP follow up.  #Chronic diastolic congestive heart failure: On metoprolol.  #Chronic kidney disease stage III: Serum creatinine level is stable. Monitor BMP. Avoid nephrotoxins  #Hypokalemia: Replete potassium. Serum potassium level improved. On oral potassium.  #Peripheral neuropathy: Lower the dose of Neurontin.  #Acute blood loss anemia likely during surgery: Hemoglobin is stable. No sign of bleeding.  Patient is clinically stable. She has fluctuation in mental status in the hospital mostly in the morning. Today her mental status improved. Her headache, tinnitus, nausea vomiting chest pressure shortness of breath. Her husband at bedside. I also spoke with her daughter. Discussed with social worker regarding discharge plan. Headache this time, patient is medically stable to transfer her care to outpatient with close follow-up.   Discharge Diagnoses:  Principal Problem:   Closed fracture of right distal femur (HCC) Active Problems:   Hyperthyroidism   Hyperlipidemia   Type II diabetes mellitus with renal manifestations (HCC)   Depression   CAD (coronary artery disease)   Peripheral neuropathy   CKD (chronic kidney disease), stage III   Fall   Leukocytosis   Chronic diastolic CHF (congestive heart failure) (HCC)   Diabetes mellitus without complication (HCC)   Acute metabolic encephalopathy   Hypokalemia    Discharge Instructions  Discharge Instructions    Call MD for:  difficulty breathing, headache or visual disturbances    Complete by:  As directed    Call MD for:  extreme fatigue    Complete by:  As directed    Call MD for:  hives    Complete by:  As directed    Call MD for:  persistant dizziness or light-headedness    Complete by:  As directed    Call MD for:  persistant nausea and vomiting    Complete by:  As directed    Call MD for:  severe uncontrolled pain    Complete by:  As  directed    Call MD for:  temperature >100.4    Complete by:  As directed    Diet - low sodium heart healthy    Complete by:  As directed    Diet Carb Modified    Complete by:  As directed    Increase activity slowly    Complete by:  As directed      Allergies as of 08/13/2017      Reactions   Shrimp [shellfish Allergy] Shortness Of Breath, Swelling   stated to her by allergist in past.    Beef-derived Products Other (See Comments)   Unknown, maybe swelling-patient cant recall at this time, stated to her by allergist in past.   Benadryl [diphenhydramine] Other (See Comments)   Causes her to fall asleep, also not able to wake her.     Ceclor [cefaclor]    Unknown   Cymbalta [duloxetine Hcl]    Levaquin [levofloxacin] Itching   Lipitor [atorvastatin] Itching   Metformin And Related Nausea And Vomiting   Other Itching   Unknown med at this time.    Pork-derived Products Other (See Comments)   Unknown, stated to her by allergist from past.       Medication List    TAKE these medications   acetaminophen 500 MG tablet Commonly known as:  TYLENOL Take 500 mg by mouth every 6 (six) hours as needed for mild pain.   aspirin EC 81 MG tablet Take 81 mg by mouth daily.   baclofen 10 MG tablet Commonly known as:  LIORESAL Take 1 tablet (10 mg total) by mouth 2 (two) times daily as needed for muscle spasms. What changed:  when to take this  reasons to take this   brimonidine 0.15 % ophthalmic solution Commonly known as:  ALPHAGAN Place 1 drop into both eyes 3 (three) times a day.   dicyclomine 20 MG tablet Commonly known as:  BENTYL Take 20 mg by mouth 2 (two) times daily.   doxepin 10 MG capsule Commonly known as:  SINEQUAN Take 10 mg by mouth at bedtime.   enoxaparin 30 MG/0.3ML injection Commonly known as:  LOVENOX Inject 0.3 mLs (30 mg total) into the skin daily.   FLUoxetine 20 MG capsule Commonly known as:  PROZAC Take 20-40 mg by mouth See admin  instructions. Takes 40mg  in morning and 20mg  in evening.   furosemide 20 MG tablet Commonly known as:  LASIX Take 60 mg by mouth daily.   gabapentin 100 MG capsule Commonly known as:  NEURONTIN Take 1 capsule (100 mg total) by mouth 2 (two) times daily. What changed:  medication strength  how much to take  when to take this   HUMULIN R 100 units/mL injection Generic drug:  insulin regular Inject 2-6 Units into the skin 3 (three) times daily before meals. Sliding scale   insulin glargine 100 UNIT/ML injection Commonly known as:  LANTUS Inject 15 Units into the skin 2 (two) times daily.   isosorbide mononitrate 60 MG 24 hr tablet Commonly known as:  IMDUR TAKE 1 TABLET (60 MG TOTAL) BY MOUTH EVERY MORNING.   Loteprednol Etabonate 0.5 % Gel Place 1 drop into both eyes 2 (two) times daily.   methimazole 5 MG tablet Commonly known as:  TAPAZOLE Take 5 mg by mouth every morning.   metoprolol tartrate 50 MG tablet Commonly known as:  LOPRESSOR  Take 50 mg by mouth 2 (two) times daily.   mupirocin ointment 2 % Commonly known as:  BACTROBAN Place 1 application into the nose 2 (two) times daily.   nitroGLYCERIN 0.4 MG SL tablet Commonly known as:  NITROSTAT Place 0.4 mg under the tongue every 5 (five) minutes as needed for chest pain.   polyethylene glycol packet Commonly known as:  MIRALAX / GLYCOLAX Take 17 g by mouth daily as needed.   potassium chloride 10 MEQ tablet Commonly known as:  K-DUR Take 20 mEq by mouth daily.   pravastatin 80 MG tablet Commonly known as:  PRAVACHOL Take one tablet (80 mg total) by mouth daily.   pyridoxine 100 MG tablet Commonly known as:  B-6 Take 100 mg by mouth daily.   senna-docusate 8.6-50 MG tablet Commonly known as:  Senokot-S Take 1 tablet by mouth at bedtime as needed for mild constipation.   timolol 0.5 % ophthalmic solution Commonly known as:  TIMOPTIC Place 1 drop into both eyes 2 (two) times daily.   traMADol 50  MG tablet Commonly known as:  ULTRAM Take 1 tablet (50 mg total) by mouth every 6 (six) hours as needed for moderate pain. What changed:  how much to take  how to take this  when to take this  reasons to take this  additional instructions   TRAVATAN Z 0.004 % Soln ophthalmic solution Generic drug:  Travoprost (BAK Free) Place 1 drop into both eyes at bedtime.   triamcinolone ointment 0.1 % Commonly known as:  KENALOG Apply to affected area twice per week   valACYclovir 500 MG tablet Commonly known as:  VALTREX Take 500 mg by mouth daily.   VITAMIN B 12 PO Take 1,000 mcg by mouth daily.   Vitamin D (Ergocalciferol) 50000 units Caps capsule Commonly known as:  DRISDOL TAKE 1 CAPSULE (50,000 UNITS TOTAL) BY MOUTH 2 (TWO) TIMES A WEEK. [facility] AND /30/18  1209   08/13/17 0000  Call MD for:  hives     08/13/17 1209   08/13/17 0000  Call MD for:  persistant dizziness or light-headedness     08/13/17 1209   08/13/17 0000  Call MD for:  extreme fatigue     08/13/17 1209   08/12/17 0000  traMADol (ULTRAM) 50 MG tablet  Every 6 hours PRN     08/12/17 0756      Contact information for follow-up providers    Donita Brooks, MD. Schedule an appointment as soon as possible for a visit in 1 week(s).   Specialty:  Family Medicine Contact information: [address] [place] [address] [phone]        Roby Lofts, MD. Schedule an appointment as soon as possible for a visit in 2 week(s).   Specialty:  Orthopedic Surgery Contact information: [address] [place] [place] [address] [phone]            Contact information for after-discharge care    Destination    [facility] Follow up.   Specialty:  Skilled Nursing Facility Contact information: [address] [place] [address] [phone]                 Allergies  Allergen Reactions  . Shrimp [Shellfish Allergy] Shortness Of Breath and Swelling    stated to her by allergist in past.   . Beef-Derived Products Other (See Comments)    Unknown, maybe swelling-patient cant recall at this time, stated to her by allergist in past.  . Benadryl [Diphenhydramine] Other (See Comments)    Causes her to fall asleep, also not able to wake her.    Corky Sox [Cefaclor]     Unknown   . Cymbalta [Duloxetine Hcl]   . Levaquin [Levofloxacin] Itching  . Lipitor [Atorvastatin] Itching  . Metformin And Related Nausea And Vomiting  . Other Itching    Unknown med at this time.   . Pork-Derived Products Other (See Comments)    Unknown, stated to her by allergist from past.     Consultations:  Orthopedics  Neurology  Cardiology  Procedures/Studies: ORIF  Subjective: Seen and examined at bedside. Denied headache,  chills, nausea vomiting chest pain shortness of breath.  Discharge Exam: Vitals:   08/13/17 1033 08/13/17 1130  BP: 128/73   Pulse: 67   Resp:    Temp:  98.4 F (36.9 C)  SpO2:     Vitals:   08/13/17 0400 08/13/17 0800 08/13/17 1033 08/13/17 1130  BP: (!) 170/58  128/73   Pulse:   67   Resp: (!) 26     Temp:  99.2 F (37.3 C)  98.4 F (36.9 C)  TempSrc:  Oral  Oral  SpO2:      Weight:      Height:        General: Pt is alert, awake, not in acute distress Cardiovascular: RRR, S1/S2 +, no rubs, no gallops Respiratory: CTA bilaterally, no wheezing, no rhonchi Abdominal: Soft, NT, ND, bowel sounds + Extremities: no edema, no cyanosis Alert awake and following commands.   The results of significant diagnostics from this hospitalization (including imaging, microbiology, ancillary and laboratory) are listed below for reference.     Microbiology: Recent Results (from the past 240 hour(s))  Surgical pcr screen     Status: None   Collection Time: 08/07/17  9:01 AM  Result Value Ref Range Status  MRSA, PCR NEGATIVE NEGATIVE Final   Staphylococcus aureus NEGATIVE NEGATIVE Final    Comment: (NOTE) The Xpert SA Assay (FDA approved for NASAL specimens in patients 65 years of age and older), is one component of a comprehensive surveillance program. It is not intended to diagnose infection nor to guide or monitor treatment.   Culture, Urine     Status: None   Collection Time: 08/07/17  9:39 AM  Result Value Ref Range Status   Specimen Description URINE, RANDOM  Final   Special Requests NONE  Final   Culture NO GROWTH  Final   Report Status 08/08/2017 FINAL  Final     Labs: BNP (last 3 results)  Recent Labs  08/07/17 0925  BNP 524.0*   Basic Metabolic Panel:  Recent Labs Lab 08/08/17 0214 08/09/17 0313 08/10/17 0320 08/11/17 0315 08/12/17 1448  NA 139 141 140 139 136  K 3.9 3.7 3.3* 4.1 4.0  CL 106 107 106 106 103  CO2 GLUCOSE 186* 194*  210* 149* 220*  BUN 30* 33* 38* 40* 38*  CREATININE 1.65* 1.71* 1.71* 1.57* 1.64*  CALCIUM 8.5* 8.6* 8.5* 9.0 8.8*   Liver Function Tests: No results for input(s): AST, ALT, ALKPHOS, BILITOT, PROT, ALBUMIN in the last 168 hours. No results for input(s): LIPASE, AMYLASE in the last 168 hours. No results for input(s): AMMONIA in the last 168 hours. CBC:  Recent Labs Lab 08/07/17 0115  08/08/17 0749 08/09/17 0313 08/10/17 0320 08/11/17 0315 08/12/17 1448  WBC 14.9*  < > 15.1* 13.7* 12.0* 12.4* 12.2*  NEUTROABS 12.3*  --   --   --   --   --   --   HGB 11.1*  < > 8.5* 8.6* 7.7* 8.5* 8.4*  HCT 33.9*  < > 25.9* 26.1* 23.3* 25.9* 25.7*  MCV 93.9  < > 95.2 95.6 96.3 94.9 94.5  PLT 302  < > 215 225 240 308 301  < > = values in this interval not displayed. Cardiac Enzymes: No results for input(s): CKTOTAL, CKMB, CKMBINDEX, TROPONINI in the last 168 hours. BNP: Invalid input(s): POCBNP CBG:  Recent Labs Lab 08/12/17 1152 08/12/17 1554 08/12/17 2100 08/13/17 0804 08/13/17 1201  GLUCAP 219* 258* 269* 155* 316*   D-Dimer No results for input(s): DDIMER in the last 72 hours. Hgb A1c No results for input(s): HGBA1C in the last 72 hours. Lipid Profile No results for input(s): CHOL, HDL, LDLCALC, TRIG, CHOLHDL, LDLDIRECT in the last 72 hours. Thyroid function studies No results for input(s): TSH, T4TOTAL, T3FREE, THYROIDAB in the last 72 hours.  Invalid input(s): FREET3 Anemia work up No results for input(s): VITAMINB12, FOLATE, FERRITIN, TIBC, IRON, RETICCTPCT in the last 72 hours. Urinalysis    Component Value Date/Time   COLORURINE YELLOW 08/07/2017 0939   APPEARANCEUR CLEAR 08/07/2017 0939   LABSPEC 1.016 08/07/2017 0939   PHURINE 6.0 08/07/2017 0939   GLUCOSEU >=500 (A) 08/07/2017 0939   HGBUR NEGATIVE 08/07/2017 0939   BILIRUBINUR NEGATIVE 08/07/2017 0939   KETONESUR 5 (A) 08/07/2017 0939   PROTEINUR 100 (A) 08/07/2017 0939   NITRITE NEGATIVE 08/07/2017 0939    LEUKOCYTESUR NEGATIVE 08/07/2017 0939   Sepsis Labs Invalid input(s): PROCALCITONIN,  WBC,  LACTICIDVEN Microbiology Recent Results (from the past 240 hour(s))  Surgical pcr screen     Status: None   Collection Time: 08/07/17  9:01 AM  Result Value Ref Range Status   MRSA, PCR NEGATIVE NEGATIVE Final   Staphylococcus aureus  NEGATIVE NEGATIVE Final    Comment: (NOTE) The Xpert SA Assay (FDA approved for NASAL specimens in patients 60 years of age and older), is one component of a comprehensive surveillance program. It is not intended to diagnose infection nor to guide or monitor treatment.   Culture, Urine     Status: None   Collection Time: 08/07/17  9:39 AM  Result Value Ref Range Status   Specimen Description URINE, RANDOM  Final   Special Requests NONE  Final   Culture NO GROWTH  Final   Report Status 08/08/2017 FINAL  Final     Time coordinating discharge: 35 minutes  SIGNED:   Maxie Barb, MD  [facility]  08/13/2017, 12:09 PM  If 7PM-7AM, please contact night-coverage [email or web address] Password [email or web address]

## 2017-08-13 NOTE — Progress Notes (Signed)
Pt d/c to facility at 1:45. Report called to RN Thurston Hole Donaghy prior to d/c. Pt has f/c in place.

## 2017-08-15 ENCOUNTER — Emergency Department (HOSPITAL_COMMUNITY): Payer: Medicare Other

## 2017-08-15 ENCOUNTER — Emergency Department (HOSPITAL_COMMUNITY)
Admission: EM | Admit: 2017-08-15 | Discharge: 2017-08-15 | Disposition: A | Discharge status: Home / Self Care | Payer: Medicare Other | Attending: Emergency Medicine | Admitting: Emergency Medicine

## 2017-08-15 ENCOUNTER — Encounter (HOSPITAL_COMMUNITY): Payer: Self-pay

## 2017-08-15 DIAGNOSIS — I5032 Chronic diastolic (congestive) heart failure: Secondary | ICD-10-CM | POA: Diagnosis not present

## 2017-08-15 DIAGNOSIS — I2510 Atherosclerotic heart disease of native coronary artery without angina pectoris: Secondary | ICD-10-CM | POA: Diagnosis not present

## 2017-08-15 DIAGNOSIS — Z87891 Personal history of nicotine dependence: Secondary | ICD-10-CM | POA: Insufficient documentation

## 2017-08-15 DIAGNOSIS — Z7901 Long term (current) use of anticoagulants: Secondary | ICD-10-CM | POA: Diagnosis not present

## 2017-08-15 DIAGNOSIS — Z7982 Long term (current) use of aspirin: Secondary | ICD-10-CM | POA: Diagnosis not present

## 2017-08-15 DIAGNOSIS — D649 Anemia, unspecified: Secondary | ICD-10-CM

## 2017-08-15 DIAGNOSIS — N183 Chronic kidney disease, stage 3 (moderate): Secondary | ICD-10-CM | POA: Diagnosis not present

## 2017-08-15 DIAGNOSIS — E1122 Type 2 diabetes mellitus with diabetic chronic kidney disease: Secondary | ICD-10-CM | POA: Diagnosis not present

## 2017-08-15 DIAGNOSIS — Z794 Long term (current) use of insulin: Secondary | ICD-10-CM | POA: Diagnosis not present

## 2017-08-15 DIAGNOSIS — K567 Ileus, unspecified: Secondary | ICD-10-CM | POA: Diagnosis not present

## 2017-08-15 DIAGNOSIS — R140 Abdominal distension (gaseous): Secondary | ICD-10-CM | POA: Insufficient documentation

## 2017-08-15 DIAGNOSIS — R400 Somnolence: Secondary | ICD-10-CM | POA: Diagnosis not present

## 2017-08-15 DIAGNOSIS — R4182 Altered mental status, unspecified: Secondary | ICD-10-CM | POA: Diagnosis present

## 2017-08-15 DIAGNOSIS — Z79899 Other long term (current) drug therapy: Secondary | ICD-10-CM | POA: Diagnosis not present

## 2017-08-15 LAB — URINALYSIS, ROUTINE W REFLEX MICROSCOPIC
Bilirubin Urine: NEGATIVE
GLUCOSE, UA: NEGATIVE mg/dL
Hgb urine dipstick: NEGATIVE
KETONES UR: NEGATIVE mg/dL
NITRITE: NEGATIVE
PROTEIN: NEGATIVE mg/dL
Specific Gravity, Urine: 1.008 (ref 1.005–1.030)
pH: 5 (ref 5.0–8.0)

## 2017-08-15 LAB — CBC WITH DIFFERENTIAL/PLATELET
BASOS ABS: 0 10*3/uL (ref 0.0–0.1)
Basophils Relative: 0 %
Eosinophils Absolute: 0.4 10*3/uL (ref 0.0–0.7)
Eosinophils Relative: 3 %
HEMATOCRIT: 22.2 % — AB (ref 36.0–46.0)
Hemoglobin: 7.4 g/dL — ABNORMAL LOW (ref 12.0–15.0)
LYMPHS ABS: 2.8 10*3/uL (ref 0.7–4.0)
LYMPHS PCT: 21 %
MCH: 31.4 pg (ref 26.0–34.0)
MCHC: 33.3 g/dL (ref 30.0–36.0)
MCV: 94.1 fL (ref 78.0–100.0)
MONOS PCT: 10 %
Monocytes Absolute: 1.3 10*3/uL — ABNORMAL HIGH (ref 0.1–1.0)
NEUTROS ABS: 8.7 10*3/uL — AB (ref 1.7–7.7)
Neutrophils Relative %: 66 %
Platelets: 388 10*3/uL (ref 150–400)
RBC: 2.36 MIL/uL — ABNORMAL LOW (ref 3.87–5.11)
RDW: 13.7 % (ref 11.5–15.5)
WBC: 13.3 10*3/uL — ABNORMAL HIGH (ref 4.0–10.5)

## 2017-08-15 LAB — COMPREHENSIVE METABOLIC PANEL
ALT: 6 U/L — ABNORMAL LOW (ref 14–54)
AST: 17 U/L (ref 15–41)
Albumin: 2.4 g/dL — ABNORMAL LOW (ref 3.5–5.0)
Alkaline Phosphatase: 87 U/L (ref 38–126)
Anion gap: 9 (ref 5–15)
BILIRUBIN TOTAL: 0.5 mg/dL (ref 0.3–1.2)
BUN: 38 mg/dL — ABNORMAL HIGH (ref 6–20)
CALCIUM: 8.7 mg/dL — AB (ref 8.9–10.3)
CO2: 25 mmol/L (ref 22–32)
CREATININE: 1.8 mg/dL — AB (ref 0.44–1.00)
Chloride: 102 mmol/L (ref 101–111)
GFR, EST AFRICAN AMERICAN: 31 mL/min — AB (ref >60)
GFR, EST NON AFRICAN AMERICAN: 27 mL/min — AB (ref >60)
Glucose, Bld: 125 mg/dL — ABNORMAL HIGH (ref 65–99)
POTASSIUM: 3.3 mmol/L — AB (ref 3.5–5.1)
Sodium: 136 mmol/L (ref 135–145)
TOTAL PROTEIN: 5.8 g/dL — AB (ref 6.5–8.1)

## 2017-08-15 LAB — RAPID URINE DRUG SCREEN, HOSP PERFORMED
AMPHETAMINES: NOT DETECTED
BENZODIAZEPINES: NOT DETECTED
Barbiturates: NOT DETECTED
COCAINE: NOT DETECTED
Opiates: NOT DETECTED
Tetrahydrocannabinol: NOT DETECTED

## 2017-08-15 LAB — I-STAT CG4 LACTIC ACID, ED: Lactic Acid, Venous: 0.75 mmol/L (ref 0.5–1.9)

## 2017-08-15 LAB — CBG MONITORING, ED: Glucose-Capillary: 120 mg/dL — ABNORMAL HIGH (ref 65–99)

## 2017-08-15 MED ORDER — NALOXONE HCL 0.4 MG/ML IJ SOLN: 0.4000 mg | Freq: Once | INTRAMUSCULAR | Status: DC

## 2017-08-15 MED ORDER — IOPAMIDOL (ISOVUE-300) INJECTION 61%
INTRAVENOUS | Status: AC
  Filled 2017-08-15: qty 30

## 2017-08-15 MED ORDER — NALOXONE HCL 2 MG/2ML IJ SOSY
0.5000 mg | PREFILLED_SYRINGE | Freq: Once | INTRAMUSCULAR | Status: AC
  Administered 2017-08-15: 0.5 mg via INTRAVENOUS
  Filled 2017-08-15: qty 2

## 2017-08-15 MED ORDER — ACETAMINOPHEN 325 MG PO TABS
650.0000 mg | ORAL_TABLET | Freq: Once | ORAL | Status: AC
  Administered 2017-08-15: 650 mg via ORAL
  Filled 2017-08-15: qty 2

## 2017-08-15 MED ORDER — MORPHINE SULFATE (PF) 4 MG/ML IV SOLN
2.0000 mg | Freq: Once | INTRAVENOUS | Status: AC
Start: 2017-08-15 — End: 2017-08-15
  Administered 2017-08-15: 2 mg via INTRAVENOUS
  Filled 2017-08-15: qty 1

## 2017-08-15 NOTE — ED Notes (Signed)
ED Provider at bedside. 

## 2017-08-15 NOTE — ED Notes (Signed)
Patient transported to CT 

## 2017-08-15 NOTE — Discharge Instructions (Signed)
You were seen today for decreased mental status. This is likely related to your pain medications. Mental status cleared during hospital stay.  You likely need to have your pain medications titrated down to avoid narcotics. Tylenol would be inappropriate adjunct. Additionally, you were found to have an ileus. There is no evidence of obstruction at this time. However, modification of diet to clear liquids until normal bowel movements and avoiding narcotics will be important. You were also found to be anemic. This is likely related to recent surgery. Repeat lab work in one week to assess for stabilization.

## 2017-08-15 NOTE — ED Provider Notes (Signed)
[facility]-EMERGENCY DEPT Provider Note   CSN: [id number] Arrival date & time: 08/15/17  0005     History   Chief Complaint Chief Complaint  Patient presents with  . Altered Mental Status  . Fatigue    HPI Sonya Campbell is a 71 y.o. female.  HPI  This is a 71 year old female with a history of coronary artery disease, chronic kidney disease, recent history of right femur fracture status post repair presents from her rehabilitation facility with concerns for decreased level of consciousness. Initially, no collateral information was provided. Reportedly last seen normal at 5:30 PM eating her dinner. However, upon arrival her husband, patient has been states that she has been less responsive since yesterday morning. She has been in her rehabilitation facility since Sunday. She is receiving gabapentin and some tramadol for pain.  Patient denies any complaints at this time; however, husband states that her abdomen has become more distended. She has not had a normal bowel movement in 3 days.  Level V caveat for altered mental status  Past Medical History:  Diagnosis Date  . CAD (coronary artery disease)   . Chronic diastolic (congestive) heart failure (HCC)   . Chronic kidney disease    stage 3  . Depression   . Diabetes mellitus without complication (HCC)   . Fibromyalgia   . Hyperlipidemia   . Hyperthyroidism   . 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 Neuropathy     Patient Active Problem List   Diagnosis Date Noted  . Hypokalemia   . Diabetes mellitus without complication (HCC)   . Acute metabolic encephalopathy   . Peripheral neuropathy 08/07/2017  . CKD (chronic kidney disease), stage III (HCC) 08/07/2017  . Fall 08/07/2017  . Closed fracture of right distal femur (HCC) 08/07/2017  . Leukocytosis 08/07/2017  . Chronic diastolic CHF (congestive heart failure) (HCC) 08/07/2017  . Hyperthyroidism   . Hyperlipidemia   . Type II  diabetes mellitus with renal manifestations (HCC)   . Depression   . CAD (coronary artery disease)     Past Surgical History:  Procedure Laterality Date  . 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 CORONARY ARTERY BYPASS GRAFT    . KNEE SURGERY     Left knee  . ORIF FEMUR FRACTURE Right 08/07/2017   Procedure: OPEN REDUCTION INTERNAL FIXATION (ORIF) DISTAL FEMUR FRACTURE;  Surgeon: Roby Lofts, MD;  Location: [facility] OR;  Service: Orthopedics;  Laterality: Right;    OB History    No data available       Home Medications    Prior to Admission medications   Medication Sig Start Date End Date Taking? Authorizing Provider  acetaminophen (TYLENOL) 500 MG tablet Take 500 mg by mouth every 6 (six) hours as needed for mild pain.    Yes [provider]  aspirin EC 81 MG tablet Take 81 mg by mouth daily.   Yes [provider]  baclofen (LIORESAL) 10 MG tablet Take 1 tablet (10 mg total) by mouth 2 (two) times daily as needed for muscle spasms. 08/13/17  Yes Maxie Barb, MD  brimonidine (ALPHAGAN) 0.15 % ophthalmic solution Place 1 drop into both eyes 3 (three) times a day.  12/06/14  Yes [provider]  Cyanocobalamin (VITAMIN B 12 PO) Take 1,000 mcg by mouth daily.   Yes [provider]  dicyclomine (BENTYL) 20 MG tablet Take  20 mg by mouth 2 (two) times daily. 02/27/17  Yes [provider]  doxepin (SINEQUAN) 10 MG capsule Take 10 mg by mouth at bedtime.   Yes [provider]  enoxaparin (LOVENOX) 30 MG/0.3ML injection Inject 0.3 mLs (30 mg total) into the skin daily. 08/13/17 09/12/17 Yes Maxie Barb, MD  FLUoxetine (PROZAC) 20 MG capsule Take 20-40 mg by mouth See admin instructions. Takes 40mg  in morning and 20mg  in evening.   Yes [provider]  furosemide (LASIX) 20 MG tablet Take 60 mg by mouth daily.   Yes [provider]  gabapentin (NEURONTIN)  100 MG capsule Take 1 capsule (100 mg total) by mouth 2 (two) times daily. 08/13/17  Yes Maxie Barb, MD  insulin glargine (LANTUS) 100 UNIT/ML injection Inject 15 Units into the skin 2 (two) times daily.    Yes [provider]  insulin regular (HUMULIN R) 100 units/mL injection Inject 0-15 Units into the skin 3 (three) times daily before meals. Sliding scale   Yes [provider]  isosorbide mononitrate (IMDUR) 60 MG 24 hr tablet TAKE 1 TABLET (60 MG TOTAL) BY MOUTH EVERY MORNING. 03/15/17  Yes [provider]  Loteprednol Etabonate 0.5 % GEL Place 1 drop into both eyes 2 (two) times daily.   Yes [provider]  methimazole (TAPAZOLE) 5 MG tablet Take 5 mg by mouth every morning.    Yes [provider]  metoprolol tartrate (LOPRESSOR) 50 MG tablet Take 50 mg by mouth 2 (two) times daily.   Yes [provider]  nitroGLYCERIN (NITROSTAT) 0.4 MG SL tablet Place 0.4 mg under the tongue every 5 (five) minutes as needed for chest pain.   Yes [provider]  polyethylene glycol (MIRALAX / GLYCOLAX) packet Take 17 g by mouth daily as needed. Patient taking differently: Take 17 g by mouth daily as needed for mild constipation.  08/13/17  Yes Maxie Barb, MD  potassium chloride (K-DUR) 10 MEQ tablet Take 20 mEq by mouth daily. 05/16/17 05/16/18 Yes [provider]  pravastatin (PRAVACHOL) 80 MG tablet Take one tablet (80 mg total) by mouth daily. 04/07/17  Yes [provider]  pyridoxine (B-6) 100 MG tablet Take 100 mg by mouth daily.   Yes [provider]  senna-docusate (SENOKOT-S) 8.6-50 MG tablet Take 1 tablet by mouth at bedtime as needed for mild constipation. 08/13/17  Yes Maxie Barb, MD  timolol (TIMOPTIC) 0.5 % ophthalmic solution Place 1 drop into both eyes 2 (two) times daily. 08/13/17  Yes Maxie Barb, MD  traMADol (ULTRAM) 50 MG tablet Take 1 tablet (50 mg total) by mouth every  6 (six) hours as needed for moderate pain. 08/12/17  Yes Maxie Barb, MD  TRAVATAN Z 0.004 % SOLN ophthalmic solution Place 1 drop into both eyes at bedtime. 06/30/17  Yes [provider]  valACYclovir (VALTREX) 500 MG tablet Take 500 mg by mouth daily. 07/31/17  Yes [provider]  Vitamin D, Ergocalciferol, (DRISDOL) 50000 units CAPS capsule TAKE 1 CAPSULE (50,000 UNITS TOTAL) BY MOUTH 2 (TWO) TIMES A WEEK. [facility] AND SUNDAY) 10/21/16  Yes [provider]    Family History Family History  Problem Relation Age of Onset  . Diabetes Mellitus II Mother   . Dementia Father   . Diabetes Mellitus II Sister   . Cancer Brother        Bladder cancer    Social History Social History  Substance Use Topics  .  Smoking status: Former Games developer  . Smokeless tobacco: Former Neurosurgeon  . Alcohol use No     Allergies   Shrimp [shellfish allergy]; Beef-derived products; Benadryl [diphenhydramine]; Ceclor [cefaclor]; Cymbalta [duloxetine hcl]; Levaquin [levofloxacin]; Lipitor [atorvastatin]; Metformin and related; Other; and Pork-derived products   Review of Systems Review of Systems  Unable to perform ROS: Mental status change     Physical Exam Updated Vital Signs BP (!) 186/69   Pulse 60   Temp 97.8 F (36.6 C) (Rectal)   Resp 10   SpO2 100%   Physical Exam  Constitutional:  elderly, somnolent but arousable, no acute distress  HENT:  Head: Normocephalic and atraumatic.  Eyes: Pupils are equal, round, and reactive to light.  Pupils 2 mm reactive bilaterally  Cardiovascular: Normal rate and regular rhythm.   No murmur heard. Pulmonary/Chest: Effort normal and breath sounds normal. No respiratory distress.  Abdominal: Soft. Bowel sounds are normal. She exhibits distension. There is tenderness. There is no rebound and no guarding.  Periumbilical tenderness to palpation, no rebound or guarding, distention noted with normal bowel sounds  Neurological:    Somnolent but arousable, initially not following commands however spontaneously moving all 4 extremities, symmetric face, disoriented  Skin: Skin is warm and dry.  Right lower leg incisions clean dry and intact, no significant erythema  Psychiatric:  Unable to assess  Nursing note and vitals reviewed.    ED Treatments / Results  Labs (all labs ordered are listed, but only abnormal results are displayed) Labs Reviewed  CBC WITH DIFFERENTIAL/PLATELET - Abnormal; Notable for the following:       Result Value   WBC 13.3 (*)    RBC 2.36 (*)    Hemoglobin 7.4 (*)    HCT 22.2 (*)    Neutro Abs 8.7 (*)    Monocytes Absolute 1.3 (*)    All other components within normal limits  COMPREHENSIVE METABOLIC PANEL - Abnormal; Notable for the following:    Potassium 3.3 (*)    Glucose, Bld 125 (*)    BUN 38 (*)    Creatinine, Ser 1.80 (*)    Calcium 8.7 (*)    Total Protein 5.8 (*)    Albumin 2.4 (*)    ALT 6 (*)    GFR calc non Af Amer 27 (*)    GFR calc Af Amer 31 (*)    All other components within normal limits  URINALYSIS, ROUTINE W REFLEX MICROSCOPIC - Abnormal; Notable for the following:    Leukocytes, UA TRACE (*)    Bacteria, UA RARE (*)    Squamous Epithelial / LPF 0-5 (*)    All other components within normal limits  CBG MONITORING, ED - Abnormal; Notable for the following:    Glucose-Capillary 120 (*)    All other components within normal limits  URINE CULTURE  RAPID URINE DRUG SCREEN, HOSP PERFORMED  I-STAT CG4 LACTIC ACID, ED    EKG  EKG Interpretation  Date/Time:  Tuesday August 15 2017 02:01:47 EDT Ventricular Rate:  60 PR Interval:    QRS Duration: 158 QT Interval:  514 QTC Calculation: 514 R Axis:   -56 Text Interpretation:  Atrial-paced complexes Right bundle branch block LVH with IVCD and secondary repol abnrm Prolonged QT interval Confirmed by Ross Marcus ([id number]) on 08/15/2017 2:31:19 AM Also confirmed by Ross Marcus [id number]), editor Elita Quick ([id number])  on 08/15/2017 6:55:12 AM       Radiology Ct Abdomen Pelvis Wo Contrast  Result Date: 08/15/2017 CLINICAL  DATA:  Diarrhea beginning yesterday. Abdominal distension. History of diabetes. EXAM: CT ABDOMEN AND PELVIS WITHOUT CONTRAST TECHNIQUE: Multidetector CT imaging of the abdomen and pelvis was performed following the standard protocol without IV contrast. Oral contrast administered. COMPARISON:  Abdominal radiograph August 15, 2017 at 0233 hours FINDINGS: LOWER CHEST: Pleural thickening and bibasilar atelectasis/scarring. The heart is moderately enlarged. Cardiac pacemaker wires in place. Aortic valve replacement. No pericardial effusion. HEPATOBILIARY: Status post cholecystectomy. Normal noncontrast CT liver. PANCREAS: Normal. SPLEEN: Normal. ADRENALS/URINARY TRACT: Kidneys are orthotopic, demonstrating normal size and morphology. Mild bilateral cortical thinning. Limited assessment for renal masses on this nonenhanced examination. 2.4 cm LEFT upper pole and 3.2 cm LEFT lower pole cysts. RIGHT parapelvic cyst and 1.8 cm upper pole cyst with punctate marginal calcifications. Bilateral renal vascular calcifications without nephrolithiasis or obstructive uropathy. The unopacified ureters are normal in course and caliber. Urinate bladder is decompressed containing a Foley bulb Ing catheter. Thickened adrenal gland seen with hyperplasia, no discrete nodule. STOMACH/BOWEL: Diffusely distended large bowel measuring up to 6 cm with air-fluid levels, including fluid level within the rectum. Mild amount of retained large bowel stool. Scattered small bowel air-fluid levels, nondistended small bowel. Normal appendix. VASCULAR/LYMPHATIC: Aortoiliac vessels are normal in course and caliber. Moderate to severe calcific atherosclerosis. No lymphadenopathy by CT size criteria. REPRODUCTIVE: Status post hysterectomy. OTHER: No intraperitoneal free fluid or free air. MUSCULOSKELETAL: Non-acute. Scattered  probable iliac bone islands. Moderate lower lumbar facet arthropathy. IMPRESSION: 1. Colonic ileus.  No bowel obstruction. 2. Status post cholecystectomy and hysterectomy. Aortic Atherosclerosis (ICD10-I70.0). Electronically Signed   By: Awilda Metro M.D.   On: 08/15/2017 06:29   Ct Head Wo Contrast  Result Date: 08/15/2017 CLINICAL DATA:  Altered level of consciousness. History of hyperlipidemia, diabetes. EXAM: CT HEAD WITHOUT CONTRAST TECHNIQUE: Contiguous axial images were obtained from the base of the skull through the vertex without intravenous contrast. COMPARISON:  CT HEAD August 09, 2017 and August 08, 2017 FINDINGS: BRAIN: No intraparenchymal hemorrhage, mass effect nor midline shift. The ventricles and sulci are normal for age. Patchy supratentorial white matter hypodensities within normal range for patient's age, though non-specific are most compatible with chronic small vessel ischemic disease. No acute large vascular territory infarcts. No abnormal extra-axial fluid collections. Basal cisterns are patent. VASCULAR: Moderate to severe calcific atherosclerosis of the carotid siphons. SKULL: No skull fracture. No significant scalp soft tissue swelling. SINUSES/ORBITS: Atretic LEFT maxillary sinus with chronic bony remodeling compatible with chronic sinusitis. Trace paranasal sinus mucosal thickening. Mastoid air cells are well aerated. The included ocular globes and orbital contents are non-suspicious. Status post bilateral ocular lens implants. Punctate calcification, less likely radiopaque foreign bodies within palpebral soft tissues. OTHER: None. IMPRESSION: Stable negative noncontrast CT HEAD for age. Electronically Signed   By: Awilda Metro M.D.   On: 08/15/2017 01:13   Dg Abdomen Acute W/chest  Result Date: 08/15/2017 CLINICAL DATA:  Abdominal distention and pain. EXAM: DG ABDOMEN ACUTE W/ 1V CHEST COMPARISON:  Chest x-ray 08/08/2017 FINDINGS: Lordotic technique is  demonstrated. Sternotomy wires and left-sided pacemaker unchanged. Lungs are somewhat hypoinflated without focal consolidation or effusion. Stable moderate cardiomegaly. Remainder of the chest is unchanged. Air-filled mildly dilated colonic loops as the transverse colon measures 8.5 cm. There are a few air-fluid levels from lay over the colon. Possible dilated small bowel loop in the right upper quadrant although difficult to accurately define. No free peritoneal air. Degenerative change of the spine and hips. IMPRESSION: Multiple air-filled mildly dilated colonic loops  with a few colonic air-fluid levels. Findings Dejonge be due to colonic ileus versus very distal colonic obstructive process. Recommend surgical follow-up abdominal films. No acute cardiopulmonary disease. Stable cardiomegaly. Electronically Signed   By: Elberta Fortis M.D.   On: 08/15/2017 02:33    Procedures Procedures (including critical care time)  Medications Ordered in ED Medications  iopamidol (ISOVUE-300) 61 % injection (not administered)  morphine 4 MG/ML injection 2 mg (not administered)  acetaminophen (TYLENOL) tablet 650 mg (not administered)  naloxone (NARCAN) injection 0.5 mg (0.5 mg Intravenous Given 08/15/17 0038)     Initial Impression / Assessment and Plan / ED Course  I have reviewed the triage vital signs and the nursing notes.  Pertinent labs & imaging results that were available during my care of the patient were reviewed by me and considered in my medical decision making (see chart for details).     Patient presents with altered mental status. Does not appear focal on exam but more encephalopathic. Vital signs reviewed and largely reassuring. She is on pain medication and has small pupils. She was initially given small dose of Narcan with only minimal improvement. However, during the course of her ED stay, she had progressive improvement of mental status back to baseline. On recheck, she is oriented 3 and able  to ride history. She reports right leg pain. She denies any abdominal pain but does report distention. Lab work notable for anemia with hemoglobin of 7.4. Discharge hemoglobin of 8.4 but I got as low as 7.7 in the hospital. She was not transfused at that time. She is otherwise asymptomatic.  CT head negative. Doubt acute stroke given physical exam and improving mental status. Urinalysis without evidence of UTI. Given distention, x-ray was obtained and shows air-fluid levels. CT scan obtained and is concerning for ileus. Feel patient's overall clinical picture is likely related to her pain medications. Suspect her somnolence is polypharmacy and pain medication related. However, she has recently had a large surgery. Additionally, her ileus is likely related to recent surgery and narcotic pain medication. I discussed with the husband that these medications needed to be titrated down as much as possible. She does not appear obstructive at this time. Would modify diet to clear liquids until patient having normal stools. They're comfortable with plan. Nursing to call rehabilitation facility to provide follow-up and recommendations.  Repeat hemoglobin recommended 1 week.  After history, exam, and medical workup I feel the patient has been appropriately medically screened and is safe for discharge home. Pertinent diagnoses were discussed with the patient. Patient was given return precautions.   Final Clinical Impressions(s) / ED Diagnoses   Final diagnoses:  Somnolence  Polypharmacy  Ileus [facility])    New Prescriptions New Prescriptions   No medications on file     Shon Baton, MD 08/15/17 [phone]

## 2017-08-15 NOTE — ED Notes (Signed)
Patient transported to X-ray 

## 2017-08-17 LAB — URINE CULTURE

## 2017-08-18 ENCOUNTER — Telehealth: Payer: Self-pay | Admitting: *Deleted

## 2017-08-18 NOTE — Telephone Encounter (Signed)
Post ED Visit - Positive Culture Follow-up  Culture report reviewed by antimicrobial stewardship pharmacist:   Enzo Bi, Pharm.D.  Celedonio Miyamoto, Pharm.D., BCPS AQ-ID  Garvin Fila, Pharm.D., BCPS  Georgina Pillion, Pharm.D., BCPS  [place], [place].D., BCPS, AAHIVP  Estella Husk, Pharm.D., BCPS, AAHIVP  Lysle Pearl, PharmD, BCPS  Casilda Carls, PharmD, BCPS  Pollyann Samples, PharmD, BCPS  Positive urine culture, reviewed by Jodi Geralds, PA-C No further patient follow-up is required at this time.  Virl Axe [facility] 08/18/2017, 10:17 AM

## 2017-10-30 ENCOUNTER — Ambulatory Visit (INDEPENDENT_AMBULATORY_CARE_PROVIDER_SITE_OTHER): Payer: Medicare Other | Admitting: Ophthalmology

## 2017-10-30 DIAGNOSIS — I10 Essential (primary) hypertension: Secondary | ICD-10-CM | POA: Diagnosis not present

## 2017-10-30 DIAGNOSIS — E10311 Type 1 diabetes mellitus with unspecified diabetic retinopathy with macular edema: Secondary | ICD-10-CM

## 2017-10-30 DIAGNOSIS — H35033 Hypertensive retinopathy, bilateral: Secondary | ICD-10-CM

## 2017-10-30 DIAGNOSIS — H43813 Vitreous degeneration, bilateral: Secondary | ICD-10-CM

## 2017-10-30 DIAGNOSIS — E103513 Type 1 diabetes mellitus with proliferative diabetic retinopathy with macular edema, bilateral: Secondary | ICD-10-CM

## 2018-01-31 ENCOUNTER — Ambulatory Visit (INDEPENDENT_AMBULATORY_CARE_PROVIDER_SITE_OTHER): Payer: Medicare Other | Admitting: Physician Assistant

## 2018-01-31 ENCOUNTER — Encounter: Payer: Self-pay | Admitting: Physician Assistant

## 2018-01-31 VITALS — BP 130/70 | HR 62 | Temp 97.7°F | Resp 14 | Wt 182.0 lb

## 2018-01-31 DIAGNOSIS — N390 Urinary tract infection, site not specified: Secondary | ICD-10-CM

## 2018-01-31 DIAGNOSIS — M545 Low back pain, unspecified: Secondary | ICD-10-CM

## 2018-01-31 DIAGNOSIS — R1030 Lower abdominal pain, unspecified: Secondary | ICD-10-CM | POA: Diagnosis not present

## 2018-01-31 LAB — COMPLETE METABOLIC PANEL WITH GFR
AG Ratio: 1.4 (calc) (ref 1.0–2.5)
ALBUMIN MSPROF: 4 g/dL (ref 3.6–5.1)
ALKALINE PHOSPHATASE (APISO): 102 U/L (ref 33–130)
ALT: 11 U/L (ref 6–29)
AST: 17 U/L (ref 10–35)
BUN / CREAT RATIO: 14 (calc) (ref 6–22)
BUN: 26 mg/dL — ABNORMAL HIGH (ref 7–25)
CO2: 23 mmol/L (ref 20–32)
Calcium: 9.7 mg/dL (ref 8.6–10.4)
Chloride: 106 mmol/L (ref 98–110)
Creat: 1.89 mg/dL — ABNORMAL HIGH (ref 0.60–0.93)
GFR, EST AFRICAN AMERICAN: 30 mL/min/{1.73_m2} — AB (ref >=60)
GFR, EST NON AFRICAN AMERICAN: 26 mL/min/{1.73_m2} — AB (ref >=60)
GLOBULIN: 2.8 g/dL (ref 1.9–3.7)
Glucose, Bld: 280 mg/dL — ABNORMAL HIGH (ref 65–99)
Potassium: 4.9 mmol/L (ref 3.5–5.3)
SODIUM: 136 mmol/L (ref 135–146)
TOTAL PROTEIN: 6.8 g/dL (ref 6.1–8.1)
Total Bilirubin: 0.3 mg/dL (ref 0.2–1.2)

## 2018-01-31 LAB — URINALYSIS, ROUTINE W REFLEX MICROSCOPIC
Bilirubin Urine: NEGATIVE
Hgb urine dipstick: NEGATIVE
Ketones, ur: NEGATIVE
Nitrite: POSITIVE — AB
PH: 5 (ref 5.0–8.0)
Specific Gravity, Urine: 1.02 (ref 1.001–1.03)

## 2018-01-31 LAB — CBC WITH DIFFERENTIAL/PLATELET
BASOS ABS: 70 {cells}/uL (ref 0–200)
BASOS PCT: 0.7 %
EOS ABS: 310 {cells}/uL (ref 15–500)
EOS PCT: 3.1 %
HEMATOCRIT: 34.3 % — AB (ref 35.0–45.0)
HEMOGLOBIN: 11.4 g/dL — AB (ref 11.7–15.5)
LYMPHS ABS: 2970 {cells}/uL (ref 850–3900)
MCH: 31.1 pg (ref 27.0–33.0)
MCHC: 33.2 g/dL (ref 32.0–36.0)
MCV: 93.5 fL (ref 80.0–100.0)
MPV: 10.7 fL (ref 7.5–12.5)
Monocytes Relative: 6.5 %
NEUTROS ABS: 6000 {cells}/uL (ref 1500–7800)
Neutrophils Relative %: 60 %
Platelets: 356 10*3/uL (ref 140–400)
RBC: 3.67 10*6/uL — ABNORMAL LOW (ref 3.80–5.10)
RDW: 13 % (ref 11.0–15.0)
Total Lymphocyte: 29.7 %
WBC mixed population: 650 cells/uL (ref 200–950)
WBC: 10 10*3/uL (ref 3.8–10.8)

## 2018-01-31 LAB — MICROSCOPIC MESSAGE

## 2018-01-31 IMAGING — CT CT HEAD W/O CM
4 series · 16 of 47 positions shown, 18 images · non-contrast
Comparison: Head CT 08/07/2017

CLINICAL DATA: Altered mental status and right-sided facial droop

EXAM:
CT HEAD WITHOUT CONTRAST
TECHNIQUE: Contiguous axial images were obtained from the base of the skull
through the vertex without intravenous contrast.

[Series 3: head without · axial · non-contrast · 0.40mm/px · z∈[-78,+42]mm · 7 of 34 slices shown, 9 images]
[im 5/34  brain]
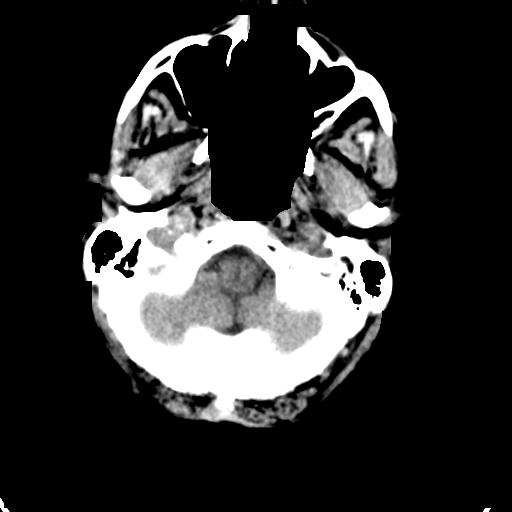
[im 5/34  bone]
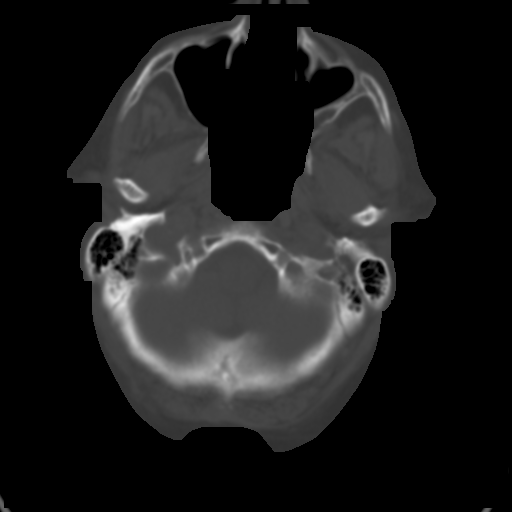
[im 9/34  brain]
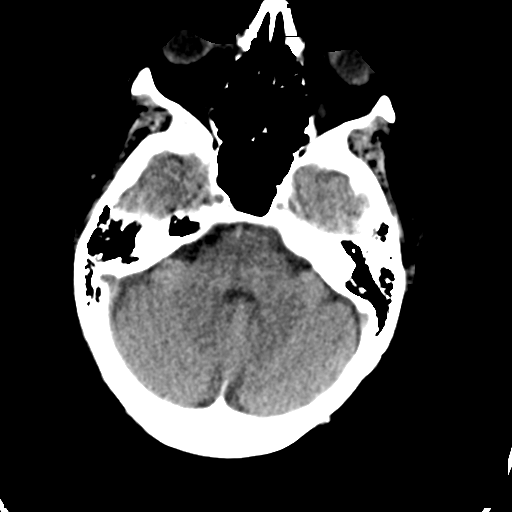
[im 13/34  brain]
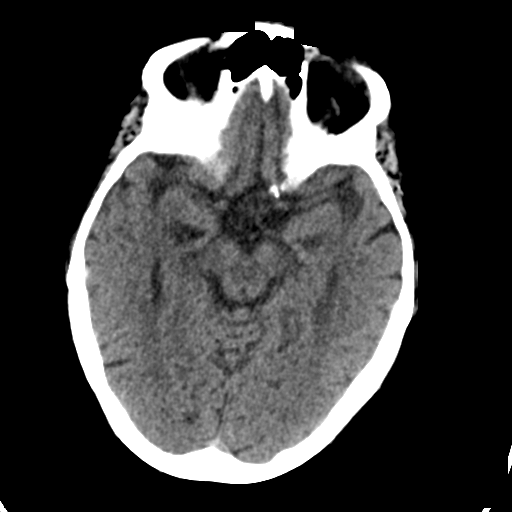
[im 17/34  brain]
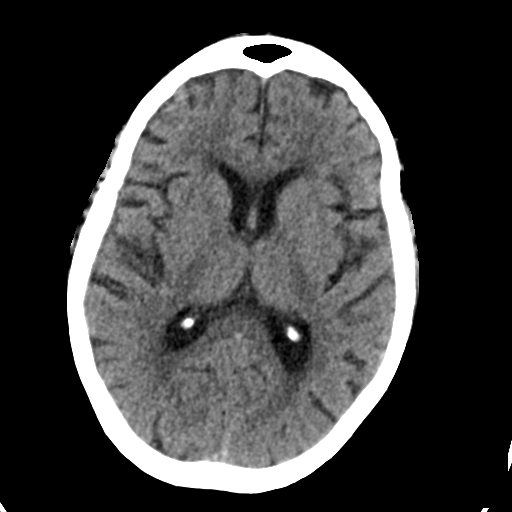
[im 21/34  brain]
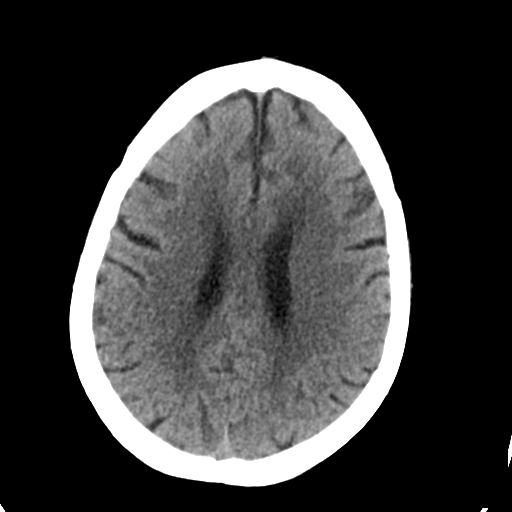
[im 21/34  bone]
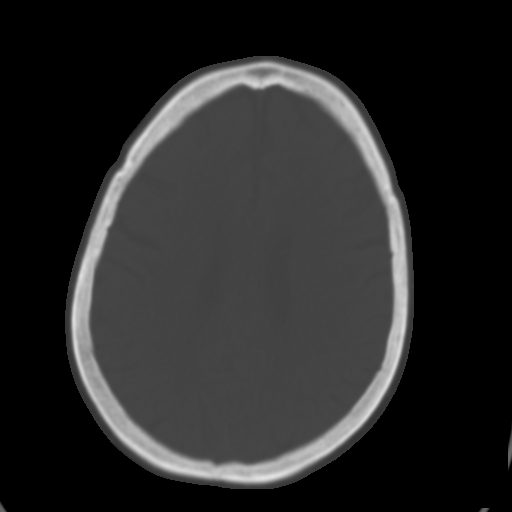
[im 25/34  brain]
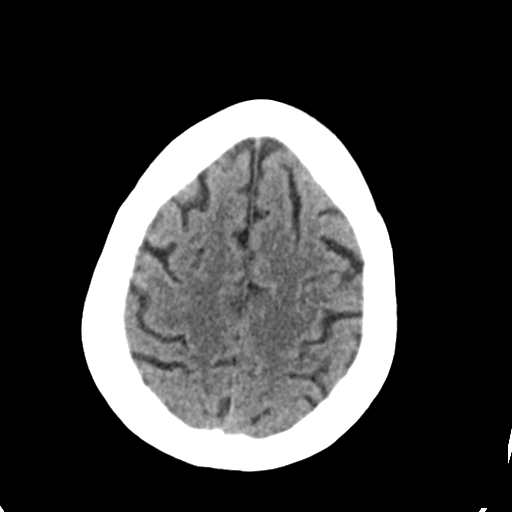
[im 29/34  brain]
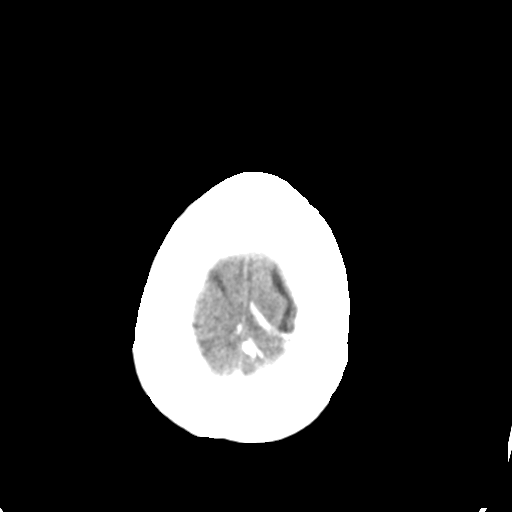

[Series 4: head bone · axial · 0.40mm/px · z∈[-82,-50]mm · 3 of 84 slices shown]
[im 9/84  bone]
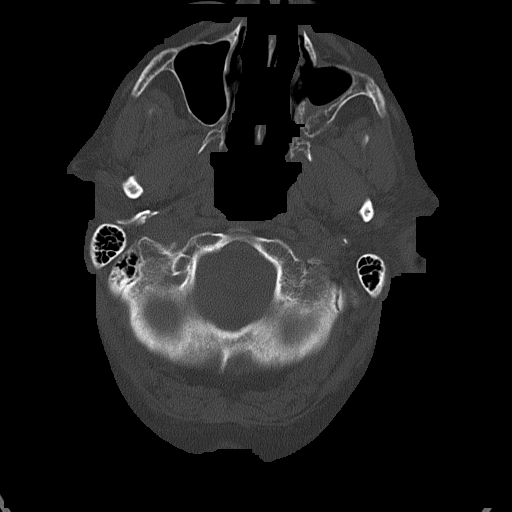
[im 17/84  bone]
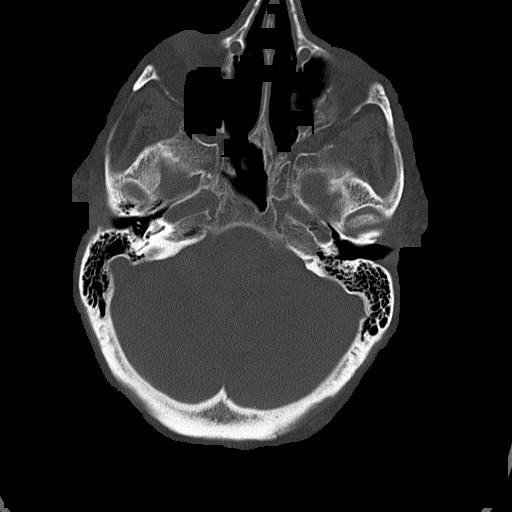
[im 25/84  bone]
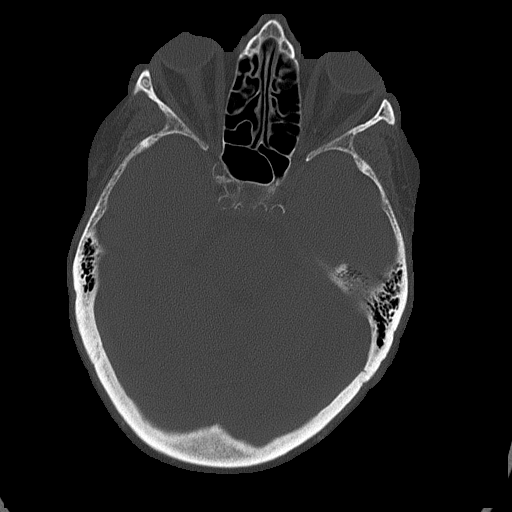

[Series 5: head without cor · coronal · non-contrast · 0.33mm/px · 3 of 67 slices shown]
[im 23/67  brain]
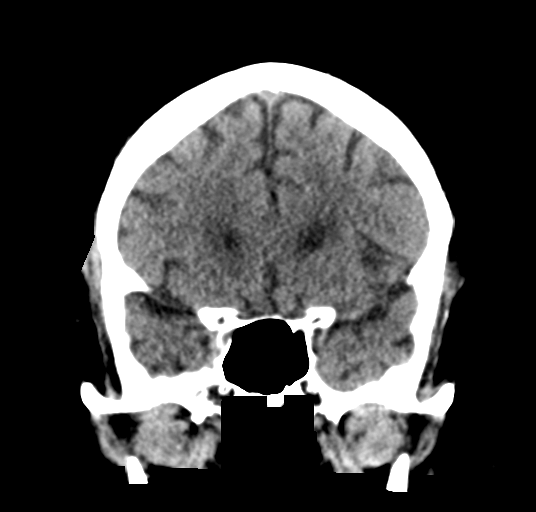
[im 30/67  brain]
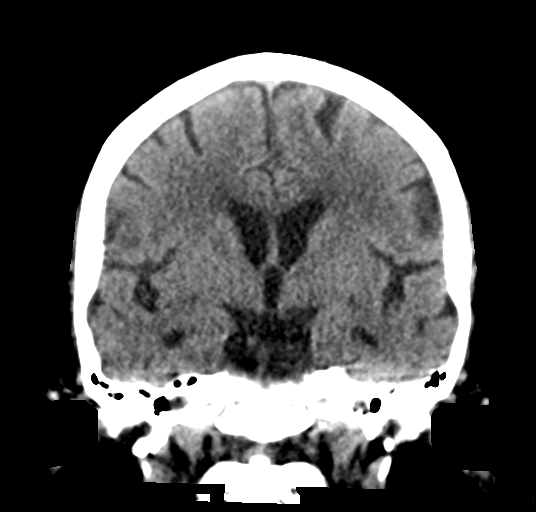
[im 37/67  brain]
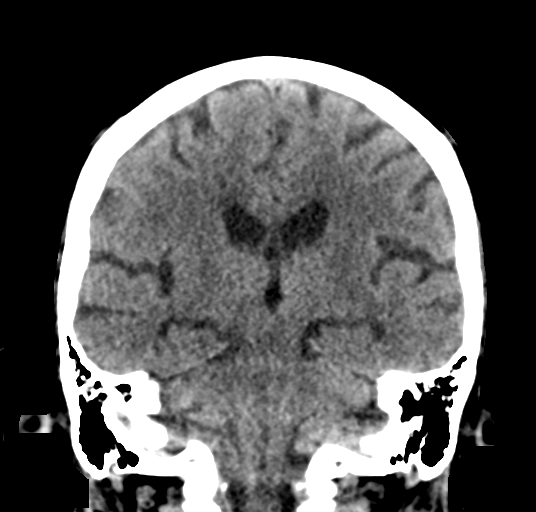

[Series 6: head without sag · sagittal · non-contrast · 0.33mm/px · 3 of 56 slices shown]
[im 19/56  brain]
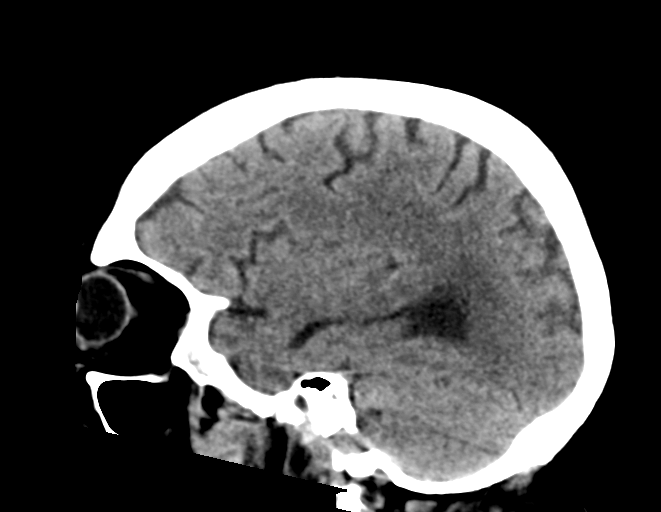
[im 28/56  brain]
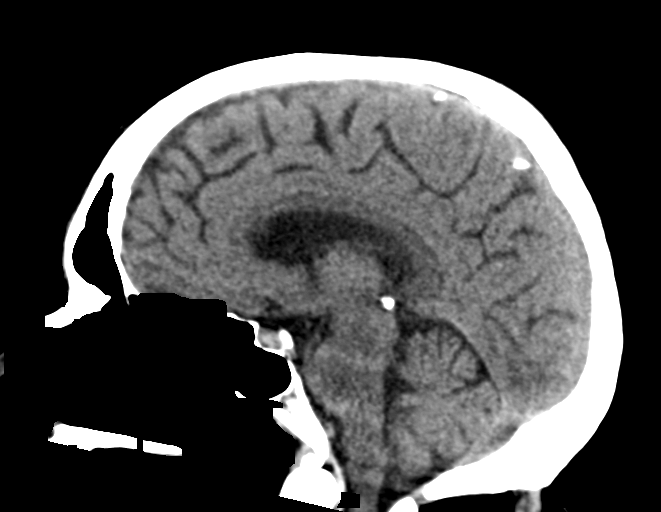
[im 37/56  brain]
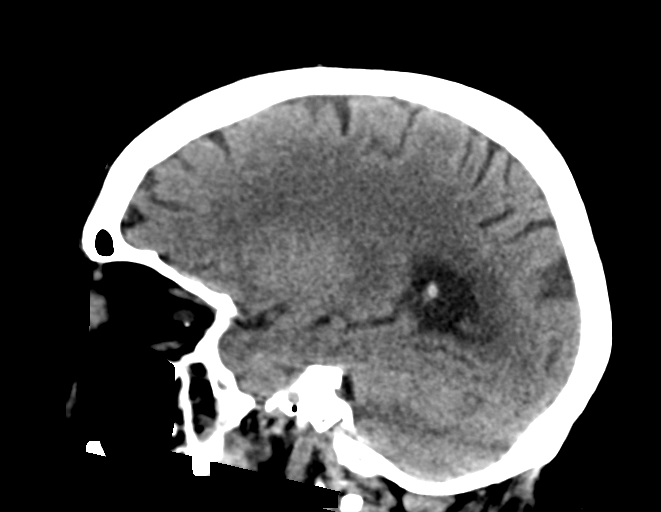

[16 of 47 positions shown; findings below may reference images not displayed]

FINDINGS: Brain: No mass lesion, intraparenchymal hemorrhage or extra-axial
collection. No evidence of acute cortical infarct. There is
periventricular hypoattenuation compatible with chronic
microvascular disease.

Vascular: No hyperdense vessel or unexpected calcification.

Skull: Normal visualized skull base, calvarium and extracranial soft
tissues.

Sinuses/Orbits: No sinus fluid levels or advanced mucosal
thickening. No mastoid effusion. Normal orbits.
IMPRESSION: Findings of chronic ischemic microangiopathy without acute
intracranial abnormality.

## 2018-01-31 MED ORDER — NITROFURANTOIN MONOHYD MACRO 100 MG PO CAPS: 100.0000 mg | ORAL_CAPSULE | Freq: Two times a day (BID) | ORAL | 0 refills | Status: AC

## 2018-01-31 NOTE — Progress Notes (Signed)
Patient ID: Sonya ShaggyLinda C Campbell MRN: [id number], DOB: 1946-09-23, 72 y.o. Date of Encounter: @DATE @  Chief Complaint:  Chief Complaint  Patient presents with  . Abdominal Pain    started the weekend  . Back Pain    HPI: 72 y.o. year old female  presents with above.   Her husband accompanies her for visit. Patient reports that the symptoms started over the weekend.   States that the pain started in her low back but soon after she first noticed that--- then she was feeling pain "all the way around"---and points fingers/hands to both sides of her lower abdomen.   Says that she "took a Correctol-- because she had not gone to the bathroom in a week ".   Also used a heating pad,  has taken Tylenol, and Tramadol also.   On Monday husband bought her some cranberry juice and she started drinking cranberry juice Monday and has continued drinking that since.    States that she feels a lot better today than it has been.    Also reports that she has been having urgency to urinate.   Also is having frequency of urination.   Says that she will urinate and then 5 minutes later will have to go again.   Also states that the pain in her suprapubic region increases at the time of urination.  Has had no fevers or chills.  Has had no pain higher up in the back at the level of costophrenic angle region.   Past Medical History:  Diagnosis Date  . CAD (coronary artery disease)   . Chronic diastolic (congestive) heart failure (HCC)   . Chronic kidney disease    stage 3  . Depression   . Diabetes mellitus without complication (HCC)   . Fibromyalgia   . Hyperlipidemia   . Hyperthyroidism   . 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 Neuropathy      Home Meds: Outpatient Medications Prior to Visit  Medication Sig Dispense Refill  . acetaminophen (TYLENOL) 500 MG tablet Take 500 mg by mouth every 6 (six) hours as needed for mild pain.     Marland Kitchen. aspirin EC 81  MG tablet Take 81 mg by mouth daily.    . baclofen (LIORESAL) 10 MG tablet Take 1 tablet (10 mg total) by mouth 2 (two) times daily as needed for muscle spasms. 30 each 0  . brimonidine (ALPHAGAN) 0.15 % ophthalmic solution Place 1 drop into both eyes 3 (three) times a day.     . Cyanocobalamin (VITAMIN B 12 PO) Take 1,000 mcg by mouth daily.    Marland Kitchen. dicyclomine (BENTYL) 20 MG tablet Take 20 mg by mouth 2 (two) times daily.  5  . doxepin (SINEQUAN) 10 MG capsule Take 10 mg by mouth at bedtime.    Marland Kitchen. FLUoxetine (PROZAC) 20 MG capsule Take 20-40 mg by mouth See admin instructions. Takes 40mg  in morning and 20mg  in evening.    . furosemide (LASIX) 20 MG tablet Take 60 mg by mouth daily.    Marland Kitchen. gabapentin (NEURONTIN) 100 MG capsule Take 1 capsule (100 mg total) by mouth 2 (two) times daily.    . insulin glargine (LANTUS) 100 UNIT/ML injection Inject 15 Units into the skin 2 (two) times daily.     . insulin regular (HUMULIN R) 100 units/mL injection Inject 0-15 Units into the skin 3 (three) times daily before meals. Sliding scale    . isosorbide mononitrate (IMDUR)  60 MG 24 hr tablet TAKE 1 TABLET (60 MG TOTAL) BY MOUTH EVERY MORNING.  11  . Loteprednol Etabonate 0.5 % GEL Place 1 drop into both eyes 2 (two) times daily.    . methimazole (TAPAZOLE) 5 MG tablet Take 5 mg by mouth every morning.     . metoprolol tartrate (LOPRESSOR) 50 MG tablet Take 50 mg by mouth 2 (two) times daily.    . nitroGLYCERIN (NITROSTAT) 0.4 MG SL tablet Place 0.4 mg under the tongue every 5 (five) minutes as needed for chest pain.    . polyethylene glycol (MIRALAX / GLYCOLAX) packet Take 17 g by mouth daily as needed. (Patient taking differently: Take 17 g by mouth daily as needed for mild constipation. ) 14 each 0  . potassium chloride (K-DUR) 10 MEQ tablet Take 20 mEq by mouth daily.    . pravastatin (PRAVACHOL) 80 MG tablet Take one tablet (80 mg total) by mouth daily.  11  . pyridoxine (B-6) 100 MG tablet Take 100 mg by mouth  daily.    Marland Kitchen senna-docusate (SENOKOT-S) 8.6-50 MG tablet Take 1 tablet by mouth at bedtime as needed for mild constipation.    . timolol (TIMOPTIC) 0.5 % ophthalmic solution Place 1 drop into both eyes 2 (two) times daily. 10 mL 0  . traMADol (ULTRAM) 50 MG tablet Take 1 tablet (50 mg total) by mouth every 6 (six) hours as needed for moderate pain. 8 tablet 0  . TRAVATAN Z 0.004 % SOLN ophthalmic solution Place 1 drop into both eyes at bedtime.  3  . valACYclovir (VALTREX) 500 MG tablet Take 500 mg by mouth daily.  11  . Vitamin D, Ergocalciferol, (DRISDOL) 50000 units CAPS capsule TAKE 1 CAPSULE (50,000 UNITS TOTAL) BY MOUTH 2 (TWO) TIMES A WEEK. [facility] AND SUNDAY)    . enoxaparin (LOVENOX) 30 MG/0.3ML injection Inject 0.3 mLs (30 mg total) into the skin daily. 9 mL 0   No facility-administered medications prior to visit.     Allergies:  Allergies  Allergen Reactions  . Shrimp [Shellfish Allergy] Shortness Of Breath and Swelling    stated to her by allergist in past.   . Beef-Derived Products Other (See Comments)    Unknown, maybe swelling-patient cant recall at this time, stated to her by allergist in past.  . Benadryl [Diphenhydramine] Other (See Comments)    Causes her to fall asleep, also not able to wake her.    Corky Sox [Cefaclor]     Unknown   . Cymbalta [Duloxetine Hcl] Other (See Comments)    unknown  . Levaquin [Levofloxacin] Itching  . Lipitor [Atorvastatin] Itching  . Metformin And Related Nausea And Vomiting  . Other Itching    Unknown med at this time.   . Pork-Derived Products Other (See Comments)    Unknown, stated to her by allergist from past.     Social History   Socioeconomic History  . Marital status: Married    Spouse name: Not on file  . Number of children: Not on file  . Years of education: Not on file  . Highest education level: Not on file  Social Needs  . Financial resource strain: Not on file  . Food insecurity - worry: Not on file  . Food  insecurity - inability: Not on file  . Transportation needs - medical: Not on file  . Transportation needs - non-medical: Not on file  Occupational History  . Not on file  Tobacco Use  . Smoking status:  Former Smoker  . Smokeless tobacco: Former Engineer, water and Sexual Activity  . Alcohol use: No  . Drug use: No  . Sexual activity: Not on file  Other Topics Concern  . Not on file  Social History Narrative  . Not on file    Family History  Problem Relation Age of Onset  . Diabetes Mellitus II Mother   . Dementia Father   . Diabetes Mellitus II Sister   . Cancer Brother        Bladder cancer     Review of Systems:  See HPI for pertinent ROS. All other ROS negative.    Physical Exam: Blood pressure 130/70, pulse 62, temperature 97.7 F (36.5 C), temperature source Oral, resp. rate 14, weight 82.6 kg (182 lb), SpO2 97 %., Body mass index is 33.29 kg/m. General: WNWD WF. Appears in no acute distress. Neck: Supple. No thyromegaly. No lymphadenopathy. Lungs: Clear bilaterally to auscultation without wheezes, rales, or rhonchi. Breathing is unlabored. Heart: RRR with S1 S2. No murmurs, rubs, or gallops. Abdomen: Soft, non-distended with normoactive bowel sounds. No hepatomegaly. No rebound/guarding. No obvious abdominal masses.  She has mild tenderness with palpation of the low mid abdomen at the suprapubic region.  No other areas of tenderness with palpation. Musculoskeletal:  Strength and tone normal for age. Extremities/Skin: Warm and dry.  Neuro: Alert and oriented X 3. Moves all extremities spontaneously. Gait is normal. CNII-XII grossly in tact. Psych:  Responds to questions appropriately with a normal affect.   Results for orders placed or performed in visit on 01/31/18  Urinalysis, Routine w reflex microscopic  Result Value Ref Range   Color, Urine YELLOW YELLOW   APPearance CLOUDY (A) CLEAR   Specific Gravity, Urine 1.020 1.001 - 1.03   pH 5.0 5.0 - 8.0    Glucose, UA 2+ (A) NEGATIVE   Bilirubin Urine NEGATIVE NEGATIVE   Ketones, ur NEGATIVE NEGATIVE   Hgb urine dipstick NEGATIVE NEGATIVE   Protein, ur 2+ (A) NEGATIVE   Nitrite POSITIVE (A) NEGATIVE   Leukocytes, UA TRACE (A) NEGATIVE   WBC, UA CANCELED   Microscopic Message  Result Value Ref Range   Note       ASSESSMENT AND PLAN:  72 y.o. year old female with    1. Urinary tract infection without hematuria, site unspecified States that patient left a small amount of urine specimen.  This is enough to do a urine dip but not the microscopic.  There is enough to send off for culture but not to do the microscopic.  Urine dip is consistent with UTI.  Will send culture.  She does have allergy to Levaquin and Ceclor.  Therefore will treat with nitrofurantoin at this time.  Is to go ahead and start this antibiotic and take as directed.  As well I will follow-up the urine culture. - nitrofurantoin, macrocrystal-monohydrate, (MACROBID) 100 MG capsule; Take 1 capsule (100 mg total) by mouth 2 (two) times daily for 7 days.  Dispense: 14 capsule; Refill: 0  2. Lower abdominal pain I suspect that all of her symptoms are secondary to this UTI but will send additional labs for further reassurance/evaluation. - Urinalysis, Routine w reflex microscopic - Urine Culture - nitrofurantoin, macrocrystal-monohydrate, (MACROBID) 100 MG capsule; Take 1 capsule (100 mg total) by mouth 2 (two) times daily for 7 days.  Dispense: 14 capsule; Refill: 0 - CBC with Differential/Platelet - COMPLETE METABOLIC PANEL WITH GFR  3. Bilateral low back pain without sciatica, unspecified chronicity -  Urinalysis, Routine w reflex microscopic    Signed, [address] [place], [place], [facility] 01/31/2018 11:54 AM

## 2018-02-02 LAB — URINE CULTURE
MICRO NUMBER:: 90351273
SPECIMEN QUALITY: ADEQUATE

## 2018-02-07 IMAGING — CR DG ABDOMEN ACUTE W/ 1V CHEST
3 series · 3 of 3 positions shown · non-contrast
Comparison: Chest x-ray 08/08/2017

CLINICAL DATA: Abdominal distention and pain.

EXAM:
DG ABDOMEN ACUTE W/ 1V CHEST

[chest pa]
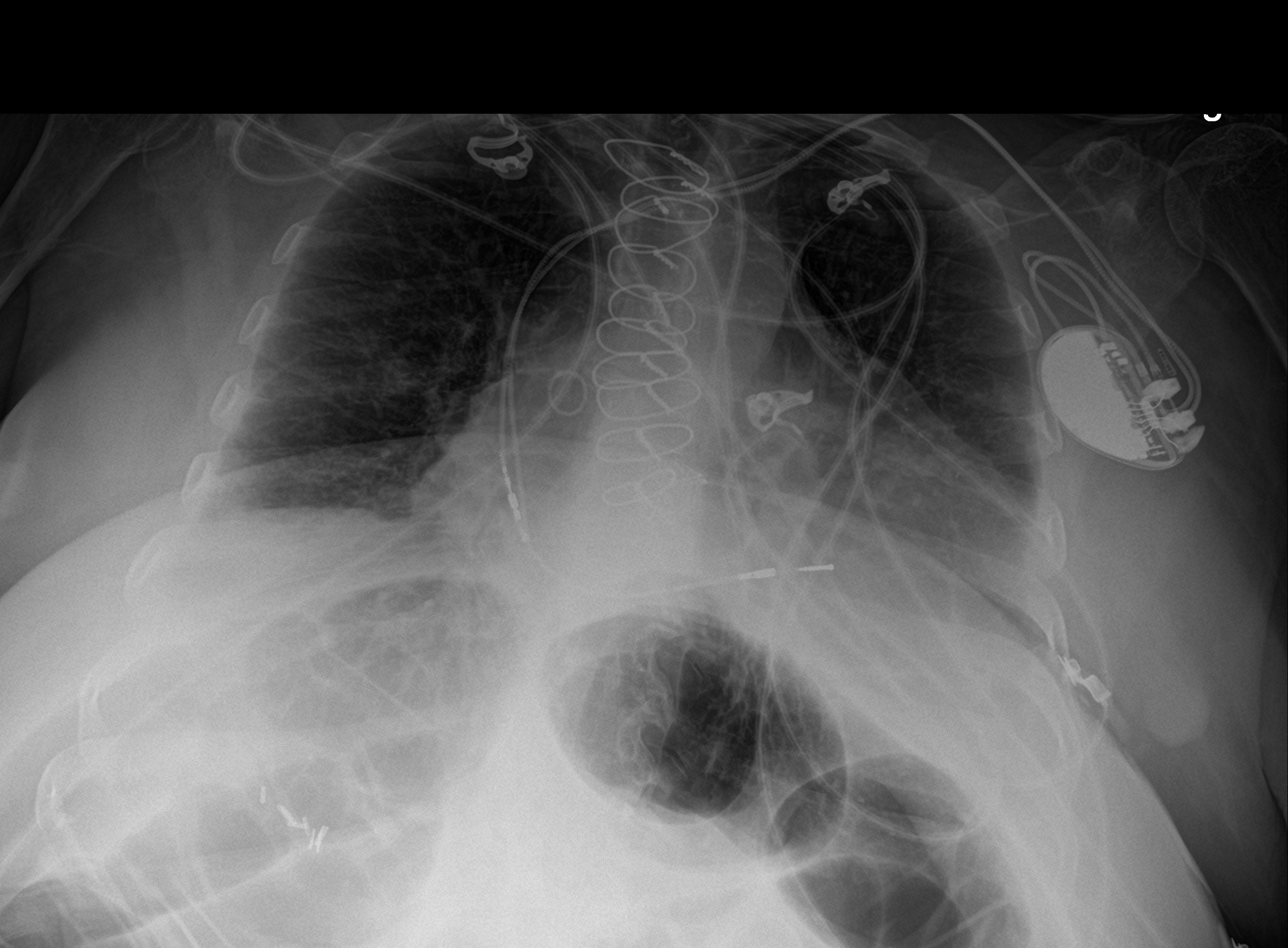

[abdomen erect]
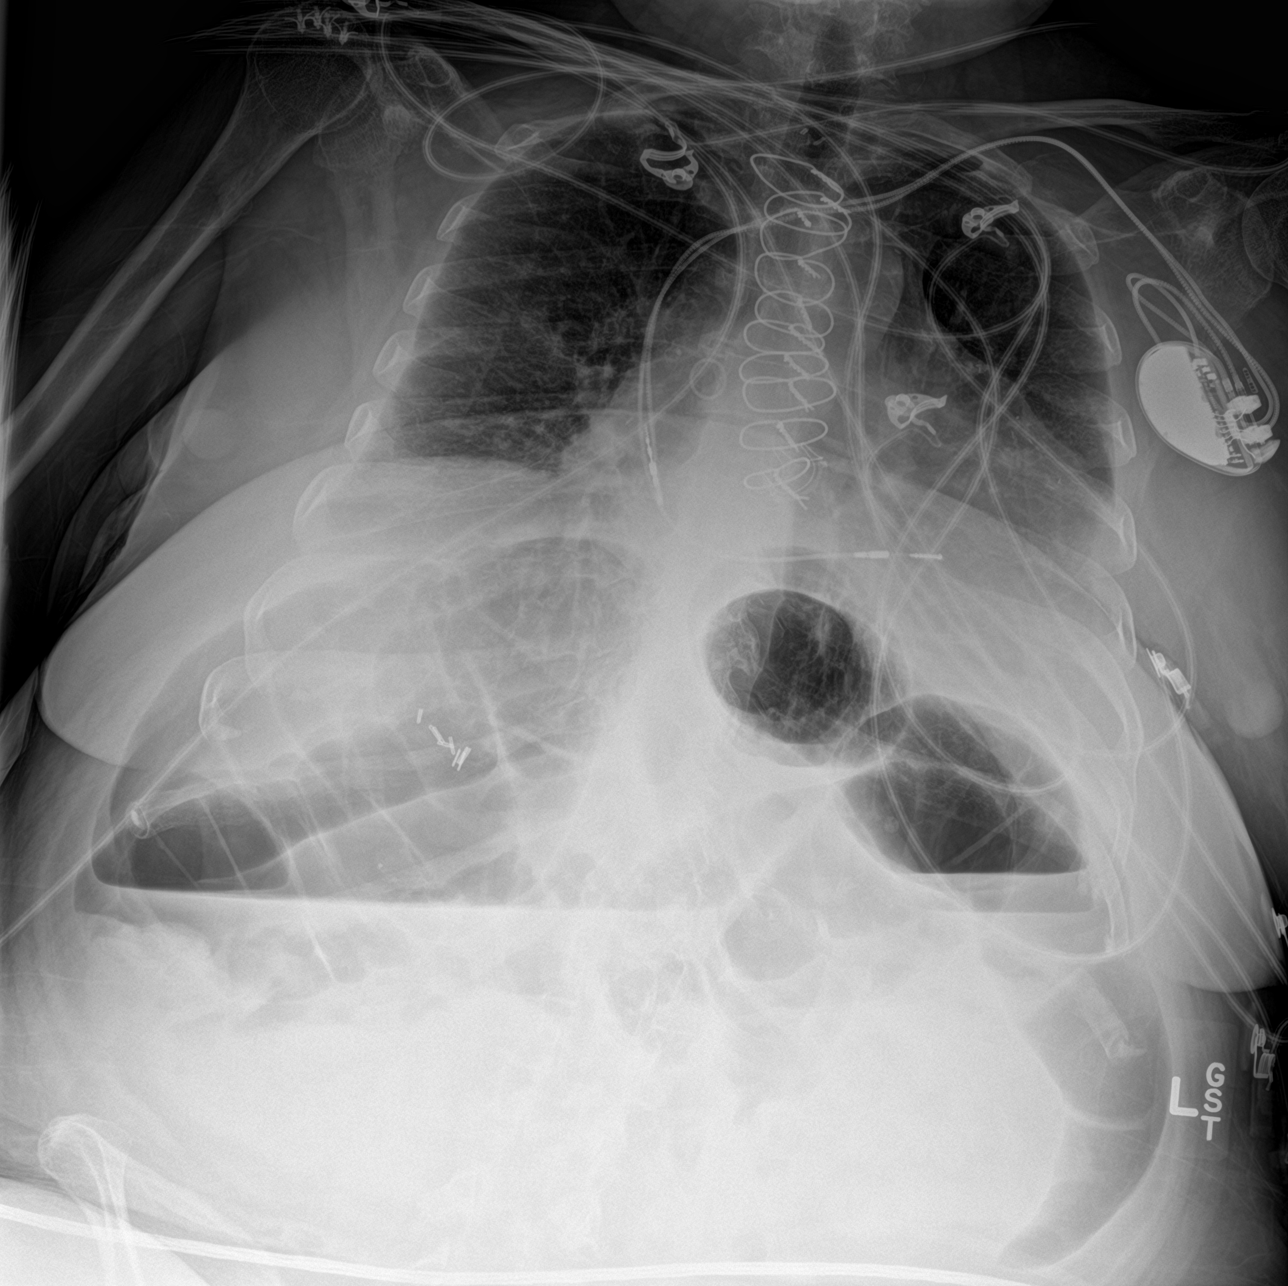

[abdomen supine]
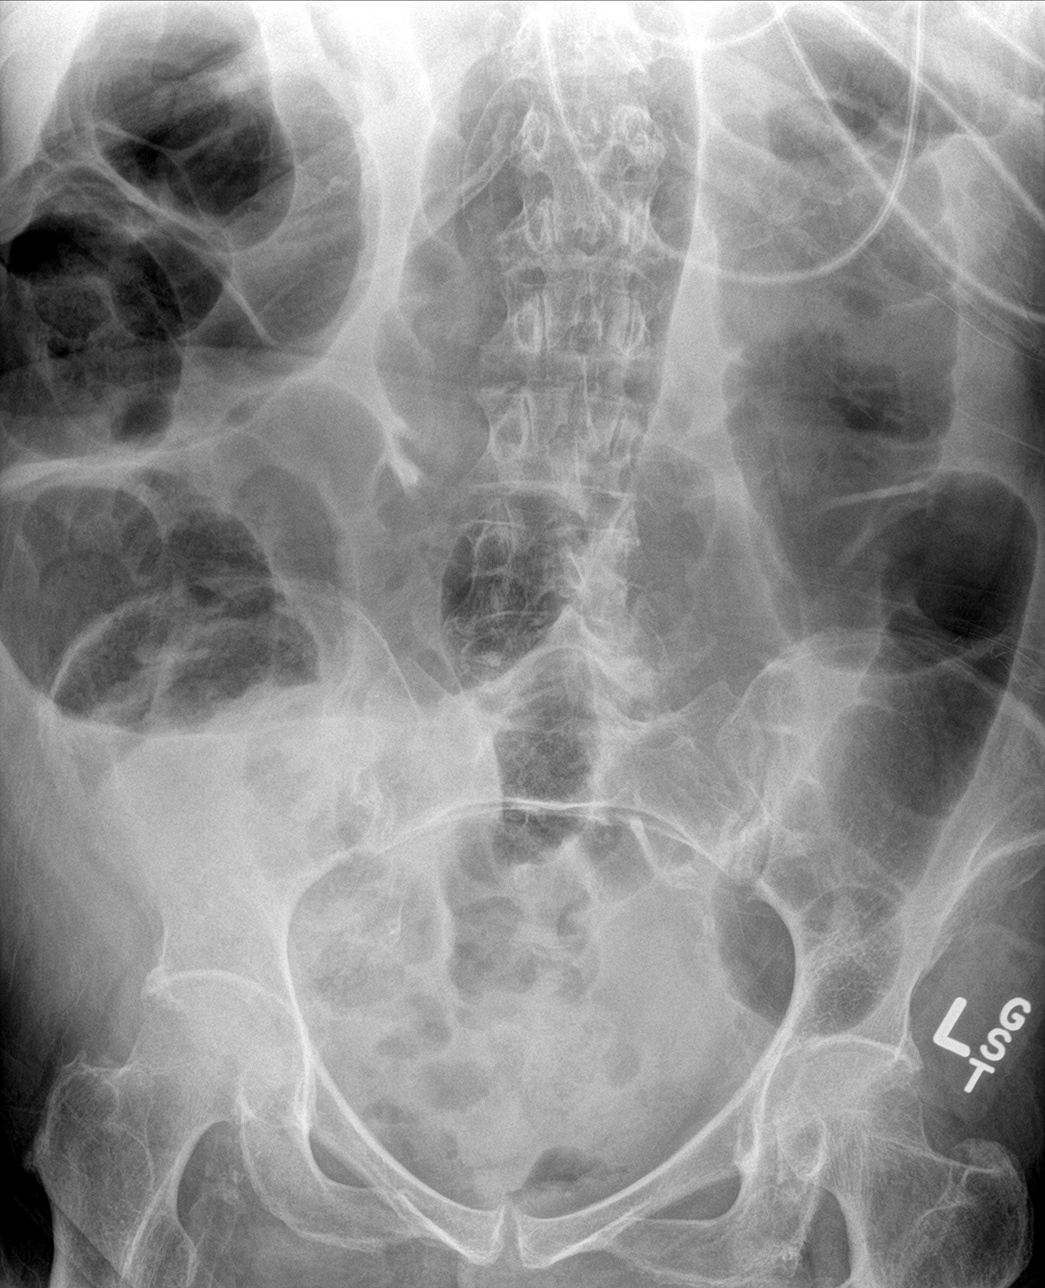

[3 of 3 positions shown; findings below may reference images not displayed]

FINDINGS: Lordotic technique is demonstrated. Sternotomy wires and left-sided
pacemaker unchanged. Lungs are somewhat hypoinflated without focal
consolidation or effusion. Stable moderate cardiomegaly. Remainder
of the chest is unchanged.

Air-filled mildly dilated colonic loops as the transverse colon
measures 8.5 cm. There are a few air-fluid levels from lay over the
colon. Possible dilated small bowel loop in the right upper quadrant
although difficult to accurately define. No free peritoneal air.
Degenerative change of the spine and hips.
IMPRESSION: Multiple air-filled mildly dilated colonic loops with a few colonic
air-fluid levels. Findings may be due to colonic ileus versus very
distal colonic obstructive process. Recommend surgical follow-up
abdominal films.

No acute cardiopulmonary disease.

Stable cardiomegaly.

## 2018-07-31 ENCOUNTER — Encounter (INDEPENDENT_AMBULATORY_CARE_PROVIDER_SITE_OTHER): Payer: Medicare Other | Admitting: Ophthalmology

## 2019-12-16 DEATH — deceased
